# Patient Record
Sex: Male | Born: 1935 | Race: White | Hispanic: No | Marital: Married | Smoking: Former smoker
Health system: Southern US, Community
[De-identification: ages and names within clinical notes are randomized; demographics above are authoritative.]

## PROBLEM LIST (undated history)

## (undated) DIAGNOSIS — I2510 Atherosclerotic heart disease of native coronary artery without angina pectoris: Secondary | ICD-10-CM

## (undated) DIAGNOSIS — I4891 Unspecified atrial fibrillation: Secondary | ICD-10-CM

## (undated) DIAGNOSIS — K5790 Diverticulosis of intestine, part unspecified, without perforation or abscess without bleeding: Secondary | ICD-10-CM

## (undated) DIAGNOSIS — R55 Syncope and collapse: Secondary | ICD-10-CM

## (undated) DIAGNOSIS — F419 Anxiety disorder, unspecified: Secondary | ICD-10-CM

## (undated) DIAGNOSIS — Z86010 Personal history of colonic polyps: Secondary | ICD-10-CM

## (undated) DIAGNOSIS — E785 Hyperlipidemia, unspecified: Secondary | ICD-10-CM

## (undated) DIAGNOSIS — I10 Essential (primary) hypertension: Secondary | ICD-10-CM

## (undated) DIAGNOSIS — I639 Cerebral infarction, unspecified: Secondary | ICD-10-CM

## (undated) DIAGNOSIS — I2119 ST elevation (STEMI) myocardial infarction involving other coronary artery of inferior wall: Secondary | ICD-10-CM

## (undated) DIAGNOSIS — K52832 Lymphocytic colitis: Secondary | ICD-10-CM

## (undated) HISTORY — DX: ST elevation (STEMI) myocardial infarction involving other coronary artery of inferior wall: I21.19

## (undated) HISTORY — DX: Unspecified atrial fibrillation: I48.91

## (undated) HISTORY — DX: Hyperlipidemia, unspecified: E78.5

## (undated) HISTORY — DX: Lymphocytic colitis: K52.832

## (undated) HISTORY — DX: Essential (primary) hypertension: I10

## (undated) HISTORY — DX: Cerebral infarction, unspecified: I63.9

## (undated) HISTORY — DX: Syncope and collapse: R55

## (undated) HISTORY — DX: Atherosclerotic heart disease of native coronary artery without angina pectoris: I25.10

## (undated) HISTORY — DX: Anxiety disorder, unspecified: F41.9

## (undated) HISTORY — DX: Diverticulosis of intestine, part unspecified, without perforation or abscess without bleeding: K57.90

## (undated) HISTORY — PX: TOTAL KNEE ARTHROPLASTY: SHX125

## (undated) HISTORY — DX: Personal history of colonic polyps: Z86.010

---

## 1997-11-07 ENCOUNTER — Emergency Department (HOSPITAL_COMMUNITY): Admission: EM | Admit: 1997-11-07 | Discharge: 1997-11-07 | Payer: Self-pay | Admitting: Emergency Medicine

## 1997-12-29 ENCOUNTER — Ambulatory Visit (HOSPITAL_COMMUNITY): Admission: RE | Admit: 1997-12-29 | Discharge: 1997-12-29 | Payer: Self-pay | Admitting: Gastroenterology

## 1999-05-27 DIAGNOSIS — I2119 ST elevation (STEMI) myocardial infarction involving other coronary artery of inferior wall: Secondary | ICD-10-CM

## 1999-05-27 HISTORY — DX: ST elevation (STEMI) myocardial infarction involving other coronary artery of inferior wall: I21.19

## 1999-05-27 HISTORY — PX: CORONARY ANGIOPLASTY WITH STENT PLACEMENT: SHX49

## 1999-06-12 ENCOUNTER — Inpatient Hospital Stay (HOSPITAL_COMMUNITY): Admission: RE | Admit: 1999-06-12 | Discharge: 1999-06-17 | Payer: Self-pay | Admitting: Specialist

## 1999-06-15 ENCOUNTER — Encounter: Payer: Self-pay | Admitting: Specialist

## 1999-11-12 ENCOUNTER — Inpatient Hospital Stay (HOSPITAL_COMMUNITY): Admission: EM | Admit: 1999-11-12 | Discharge: 1999-11-12 | Payer: Self-pay | Admitting: Emergency Medicine

## 1999-11-12 ENCOUNTER — Encounter: Payer: Self-pay | Admitting: Emergency Medicine

## 2002-08-25 ENCOUNTER — Emergency Department (HOSPITAL_COMMUNITY): Admission: EM | Admit: 2002-08-25 | Discharge: 2002-08-25 | Payer: Self-pay | Admitting: Emergency Medicine

## 2009-03-30 ENCOUNTER — Encounter (INDEPENDENT_AMBULATORY_CARE_PROVIDER_SITE_OTHER): Payer: Self-pay | Admitting: *Deleted

## 2009-04-16 ENCOUNTER — Encounter (INDEPENDENT_AMBULATORY_CARE_PROVIDER_SITE_OTHER): Payer: Self-pay | Admitting: *Deleted

## 2009-04-17 ENCOUNTER — Ambulatory Visit: Payer: Self-pay | Admitting: Gastroenterology

## 2009-04-25 DIAGNOSIS — Z860100 Personal history of colon polyps, unspecified: Secondary | ICD-10-CM

## 2009-04-25 HISTORY — DX: Personal history of colon polyps, unspecified: Z86.0100

## 2009-04-25 HISTORY — DX: Personal history of colonic polyps: Z86.010

## 2009-05-02 ENCOUNTER — Ambulatory Visit: Payer: Self-pay | Admitting: Gastroenterology

## 2009-05-04 ENCOUNTER — Encounter: Payer: Self-pay | Admitting: Gastroenterology

## 2009-09-09 ENCOUNTER — Emergency Department (HOSPITAL_COMMUNITY): Admission: EM | Admit: 2009-09-09 | Discharge: 2009-09-09 | Payer: Self-pay | Admitting: Emergency Medicine

## 2010-08-09 ENCOUNTER — Encounter (INDEPENDENT_AMBULATORY_CARE_PROVIDER_SITE_OTHER): Payer: Self-pay | Admitting: *Deleted

## 2010-08-13 LAB — BASIC METABOLIC PANEL
BUN: 15 mg/dL (ref 6–23)
CO2: 26 mEq/L (ref 19–32)
Calcium: 8.9 mg/dL (ref 8.4–10.5)
Chloride: 98 mEq/L (ref 96–112)
Creatinine, Ser: 1.06 mg/dL (ref 0.4–1.5)
GFR calc Af Amer: >60 mL/min (ref >60)
GFR calc non Af Amer: >60 mL/min (ref >60)
Glucose, Bld: 131 mg/dL — ABNORMAL HIGH (ref 70–99)
Potassium: 4.4 mEq/L (ref 3.5–5.1)
Sodium: 132 mEq/L — ABNORMAL LOW (ref 135–145)

## 2010-08-13 LAB — CBC
HCT: 37.6 % — ABNORMAL LOW (ref 39.0–52.0)
Hemoglobin: 13.3 g/dL (ref 13.0–17.0)
MCHC: 35.4 g/dL (ref 30.0–36.0)
MCV: 99 fL (ref 78.0–100.0)
Platelets: 218 10*3/uL (ref 150–400)
RBC: 3.8 MIL/uL — ABNORMAL LOW (ref 4.22–5.81)
RDW: 12.6 % (ref 11.5–15.5)
WBC: 11.1 10*3/uL — ABNORMAL HIGH (ref 4.0–10.5)

## 2010-08-13 LAB — DIFFERENTIAL
Basophils Absolute: 0 K/uL (ref 0.0–0.1)
Basophils Relative: 0 % (ref 0–1)
Eosinophils Absolute: 0.1 K/uL (ref 0.0–0.7)
Eosinophils Relative: 1 % (ref 0–5)
Lymphocytes Relative: 6 % — ABNORMAL LOW (ref 12–46)
Lymphs Abs: 0.7 K/uL (ref 0.7–4.0)
Monocytes Absolute: 0.8 K/uL (ref 0.1–1.0)
Monocytes Relative: 7 % (ref 3–12)
Neutro Abs: 9.6 K/uL — ABNORMAL HIGH (ref 1.7–7.7)
Neutrophils Relative %: 86 % — ABNORMAL HIGH (ref 43–77)

## 2010-08-13 LAB — POCT CARDIAC MARKERS
CKMB, poc: 1.6 ng/mL (ref 1.0–8.0)
Myoglobin, poc: 65.1 ng/mL (ref 12–200)
Troponin i, poc: <0.05 ng/mL (ref 0.00–0.09)

## 2010-08-13 LAB — PROTIME-INR
INR: 1.1 (ref 0.00–1.49)
Prothrombin Time: 14.2 seconds (ref 11.6–15.2)

## 2010-08-13 LAB — APTT: aPTT: 30 s (ref 24–37)

## 2010-08-13 NOTE — Letter (Signed)
Summary: New Patient letter  [facility]  [address] [place], [place] [address]   Phone: [phone]  Fax: [phone]       08/09/2010 MRN: [id number]  Wyatt [name] [address] [place], [place]  [address]  Dear Mr. KURKA,  Welcome to the Gastroenterology Division at [facility].    You are scheduled to see Dr.  Jarold Motto on 09-12-10 at 8:30A.M.  on the 3rd floor at [facility], [address]  We ask that you try to arrive at our office 15 minutes prior to your appointment time to allow for check-in.  We would like you to complete the enclosed self-administered evaluation form prior to your visit and bring it with you on the day of your appointment.  We will review it with you.  Also, please bring a complete list of all your medications or, if you prefer, bring the medication bottles and we will list them.  Please bring your insurance card so that we may make a copy of it.  If your insurance requires a referral to see a specialist, please bring your referral form from your primary care physician.  Co-payments are due at the time of your visit and may be paid by cash, check or credit card.     Your office visit will consist of a consult with your physician (includes a physical exam), any laboratory testing he/she may order, scheduling of any necessary diagnostic testing (e.g. x-ray, ultrasound, CT-scan), and scheduling of a procedure (e.g. Endoscopy, Colonoscopy) if required.  Please allow enough time on your schedule to allow for any/all of these possibilities.    If you cannot keep your appointment, please call [phone] to cancel or reschedule prior to your appointment date.  This allows [place] the opportunity to schedule an appointment for another patient in need of care.  If you do not cancel or reschedule by 5 p.m. the business day prior to your appointment date, you will be charged a $50.00 late cancellation/no-show fee.    Thank you for  choosing [facility] for your medical needs.  We appreciate the opportunity to care for you.  Please visit [place] at our website  to learn more about our practice.                     Sincerely,                                                             The Gastroenterology Division

## 2010-08-15 ENCOUNTER — Telehealth: Payer: Self-pay | Admitting: Gastroenterology

## 2010-08-15 DIAGNOSIS — R110 Nausea: Secondary | ICD-10-CM

## 2010-08-15 DIAGNOSIS — R197 Diarrhea, unspecified: Secondary | ICD-10-CM

## 2010-08-15 DIAGNOSIS — R109 Unspecified abdominal pain: Secondary | ICD-10-CM

## 2010-08-15 MED ORDER — ONDANSETRON HCL 4 MG PO TABS: ORAL_TABLET | ORAL | Status: DC

## 2010-08-15 NOTE — Telephone Encounter (Signed)
Wife stated pt has had diarrhea x 3 days with cramping and nausea. Pt sees Dr Jarold Motto, usually no problems, just for COLONs. 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 Pt takes only Lipitor, Lisinopril and Metoprolol. Pt saw Dr Juleen China yesterday who did stool for O&P, CBC, CMP which were all normal. He was placed on Flagyl 500mg  qid, but pt can only tolerate tid.Diet doesn't seem to matter, but dairy products have been stopped. Nothing available tomorrow for Dr Jarold Motto or mid level. Amy Esterwood, PA ordered Stool for CDIFF, WBC and CULTURE and Zofran for nausea.  We will call with results and go from there. Wife stated understanding.

## 2010-08-16 ENCOUNTER — Other Ambulatory Visit: Payer: Medicare Other

## 2010-08-16 ENCOUNTER — Other Ambulatory Visit: Payer: Self-pay | Admitting: Physician Assistant

## 2010-08-16 DIAGNOSIS — R197 Diarrhea, unspecified: Secondary | ICD-10-CM

## 2010-08-16 DIAGNOSIS — R110 Nausea: Secondary | ICD-10-CM

## 2010-08-16 DIAGNOSIS — R109 Unspecified abdominal pain: Secondary | ICD-10-CM

## 2010-08-20 ENCOUNTER — Telehealth: Payer: Self-pay | Admitting: Gastroenterology

## 2010-08-20 DIAGNOSIS — R197 Diarrhea, unspecified: Secondary | ICD-10-CM

## 2010-08-21 LAB — CLOSTRIDIUM DIFFICILE BY PCR: Toxigenic C. Difficile by PCR: NOT DETECTED

## 2010-08-21 NOTE — Telephone Encounter (Signed)
Wyatt [name],try to be sure all the stool cultures have been submitted to wherever they are supposed to go... You can add him on for me for tomorrow or Friday afternoon.Aurea Graff is opening 2 appts for me   Each day  Starting at 1:30  thanks

## 2010-08-21 NOTE — Telephone Encounter (Signed)
Phoned pt's wife to ask how pt was doing. She reports pt no longer

## 2010-08-21 NOTE — Telephone Encounter (Signed)
ON 08/15/10, we ordered stool studies on pt- WBC, CDIFF by PCR and CULTURE. We never received any results, so I didn't know if the pt came or not. PCP's office called again yesterday with family requesting an appt. Called [employer] after looking in our order system and only the CDIFF was received. For some reason that order is messed up and was just sent out yesterday, 08/20/10. Worked with our lab downstairs and they only show the CDIFF even though [facility] has all 3 orders in. Per [place], it will take a few days to get the results back. I could get Aurea Graff to open up a slot for [facility] tomorrow or I can redo the stool cx which we need. Amy, please advise.

## 2010-08-21 NOTE — Telephone Encounter (Signed)
Completing last note. Per pt's wife, pt no longer has diarrhea, he's constipated now and still has abdominal cramping on and off. She reports he is feeling some better, but not back to normal. Amy Esterwood, PA suggested completing the stool studies if possible and seeing a mid level next week.  Spoke to wife and informed her I will call with an appt with a mid level for next week; she stated understanding.

## 2010-08-22 ENCOUNTER — Other Ambulatory Visit: Payer: Medicare Other

## 2010-08-22 DIAGNOSIS — R197 Diarrhea, unspecified: Secondary | ICD-10-CM

## 2010-08-22 NOTE — Telephone Encounter (Signed)
Spoke with pt's wife and then his daughter Karoline Caldwell, who wants the pt to have an appt soon. Explained the mix up in the labs and that we reordered the WBC and CULTURE for stool. Explained we were trying to get a headstart by r/o something wrong with a stool. Pt will be seen by a NP on 08/26/10 and hopefully the results will be back. Daughter wants pt to have a COLON and wants it soon, I explained Dr Jarold Motto has appt in mid April and that shouldn't be a problem. The CDIFF was negative. Family stated understanding.

## 2010-08-23 LAB — FECAL LACTOFERRIN, QUANT: Lactoferrin: POSITIVE

## 2010-08-26 ENCOUNTER — Encounter: Payer: Self-pay | Admitting: Nurse Practitioner

## 2010-08-26 ENCOUNTER — Ambulatory Visit (INDEPENDENT_AMBULATORY_CARE_PROVIDER_SITE_OTHER): Payer: Medicare Other | Admitting: Nurse Practitioner

## 2010-08-26 VITALS — BP 132/64 | HR 60 | Ht 65.0 in | Wt 173.0 lb

## 2010-08-26 DIAGNOSIS — R197 Diarrhea, unspecified: Secondary | ICD-10-CM

## 2010-08-26 NOTE — Progress Notes (Signed)
Wyatt [name] [id number] 22-Mar-1929   History of Present Illness: Wyatt [name] were is an 77 -year-old white male followed by Dr. Arlyce Dice for colorectal cancer screenings. His last screening exam was in December 2010 Patient comes in today for evaluation of recent crampy diarrhea. Several weeks ago the patient saw his primary care physician, Dr. Juleen China, for a six-day history of crampy diarrhea. He was given Lomotil and stool studies were sent. Stool for ova and parasites was negative. CBC unremarkable except for elevated MCV, comprehensive metabolic profile notable only for a sodium of 131 and chloride of 96. KUB unremarkable.  Patient returned to his PCP on March 15 at which time he complained of persistent crampy diarrhea. At that time he was started on Flagyl 500 mg 4 times a day. Patient began the medication but at a lower dose, tried to work himself up to 4 times a day. After a few days the patient complained of burning skin and hands. The patient had been referred to our office for an appointment and it was at this time the patient's daughter called to get a sooner appointment. We advised discontinuation of Flagyl. We ordered additional stool studies to include C. difficile PCR which was negative and lactoferrin which was positive. Wyatt [name] became constipated with Lomotil. He discontinued the medication, took some Alka-Seltzer, had a large bowel movement and stools have been normal for almost 4 days now. Never did have any blood in his stool. No weight loss. He had not had any antibiotics preceding the onset of diarrhea. He does complain of intermittent fatigue  but gets spurts of energy in between those times. He hit 150 golf balls yesterday.    Current Medications, Allergies, Past Medical History, Past Surgical History, Family History and Social History were reviewed in [employer] record.   Physical Exam: General: Well developed , well nourished white male no acute  distress Head: Normocephalic and atraumatic Eyes:  sclerae anicteric,conjunctive pink. Ears: Normal auditory acuity Mouth: No deformity or lesions Neck: Supple, no masses.  Lungs: Clear throughout to auscultation Heart: Regular rate and rhythm; no murmurs heard Abdomen: Soft, nontender, nondistended. No masses or hepatomegaly noted. Normal bowel sounds Rectal: Not done Musculoskeletal: Symmetrical with no gross deformities  Skin: No lesions on visible extremities Extremities: No edema or deformities noted Neurological: Alert oriented x 4, grossly nonfocal Cervical Nodes:  No significant cervical adenopathy Psychological:  Alert and cooperative. Normal mood and affect  Assessment and Plan: Diarrhea Acute diarrhea, negative C. difficile by PCR. Lactoferrin was positive . Labs 08/13/2009 were essentially normal with a white count of 8.1 hemoglobin 15.1. MCV mildly elevated at 97.8. Platelets 189. Comprehensive metabolic,profile normal except for sodium slightly low 133. TSH normal at 0.55. Physical exam unremarkable. Suspect gastroenteritis which has now resolved. Patient will call our office if he has any recurrent symptoms.

## 2010-08-26 NOTE — Assessment & Plan Note (Addendum)
Acute diarrhea, negative C. difficile by PCR. Lactoferrin was positive . Labs 08/13/2009 were essentially normal with a white count of 8.1 hemoglobin 15.1. MCV mildly elevated at 97.8. Platelets 189. Comprehensive metabolic,profile normal except for sodium slightly low 133. TSH normal at 0.55. Physical exam unremarkable. Suspect gastroenteritis which has now resolved. Patient will call our office if he has any recurrent symptoms.

## 2010-08-26 NOTE — Progress Notes (Signed)
Reviewed and agree DB 

## 2010-08-28 ENCOUNTER — Encounter: Payer: Self-pay | Admitting: Nurse Practitioner

## 2010-08-28 ENCOUNTER — Telehealth: Payer: Self-pay | Admitting: Gastroenterology

## 2010-08-28 ENCOUNTER — Ambulatory Visit (AMBULATORY_SURGERY_CENTER): Payer: Medicare Other | Admitting: *Deleted

## 2010-08-28 ENCOUNTER — Encounter: Payer: Self-pay | Admitting: Gastroenterology

## 2010-08-28 VITALS — Ht 68.5 in | Wt 175.0 lb

## 2010-08-28 DIAGNOSIS — R109 Unspecified abdominal pain: Secondary | ICD-10-CM

## 2010-08-28 DIAGNOSIS — R197 Diarrhea, unspecified: Secondary | ICD-10-CM

## 2010-08-28 MED ORDER — PEG-KCL-NACL-NASULF-NA ASC-C 100 G PO SOLR: ORAL | Status: DC

## 2010-08-28 NOTE — Telephone Encounter (Signed)
Ok..Marland KitchenMarland Kitchendrp

## 2010-08-28 NOTE — Telephone Encounter (Addendum)
OK for DIRECT COLON on 09/02/10, Pre Visit today ok'd by Quincy Carnes in [facility]. Wife and pt's daughter notified.

## 2010-08-28 NOTE — Telephone Encounter (Addendum)
Dr Jarold Motto, pt saw [place] on 08/26/10 for diarrhea and cramping for almost 4 weeks. Dr Marylen Ponto office had asked for the appt. He did O&P of stool that was negative and placed him on FLAGYL. You had no appts and neither did Amy so she ordered stools for CDIFF, WBC and CULTURE just to get started until he could be seen. The only done by Lab was the Cdiff which was - and we reordered the others. Onlyy WBC was +. Pt was ok by the time he saw [place]. Pt's daughter is an Charity fundraiser who lives out of town and stated pt is still having problems with rectal fullness and small leakage of stoll on his underwear. She is concerned pt may have another polyp or worse at his age. Last COLON 05/02/09 with adenomatous polyp. She stated pt has lost weight- we only have new pt. Pt and daughter would like a direct COLON- you have appt on 09/02/10 and 09/06/10- She asked that I reserve one on 09/06/10. OK to schedule?

## 2010-08-29 ENCOUNTER — Encounter: Payer: Self-pay | Admitting: *Deleted

## 2010-09-02 ENCOUNTER — Ambulatory Visit (AMBULATORY_SURGERY_CENTER): Payer: Medicare Other | Admitting: Gastroenterology

## 2010-09-02 ENCOUNTER — Encounter: Payer: Self-pay | Admitting: Gastroenterology

## 2010-09-02 VITALS — BP 142/77 | HR 73 | Temp 97.4°F | Resp 18 | Ht 68.0 in | Wt 175.0 lb

## 2010-09-02 DIAGNOSIS — Z860100 Personal history of colon polyps, unspecified: Secondary | ICD-10-CM

## 2010-09-02 DIAGNOSIS — K5730 Diverticulosis of large intestine without perforation or abscess without bleeding: Secondary | ICD-10-CM

## 2010-09-02 DIAGNOSIS — R197 Diarrhea, unspecified: Secondary | ICD-10-CM

## 2010-09-02 DIAGNOSIS — K5289 Other specified noninfective gastroenteritis and colitis: Secondary | ICD-10-CM

## 2010-09-02 MED ORDER — SODIUM CHLORIDE 0.9 % IV SOLN: 500.0000 mL | INTRAVENOUS | Status: DC

## 2010-09-02 NOTE — Patient Instructions (Signed)
Discharge instructions given with verbal understanding. Handout on Diverticulosis given.

## 2010-09-03 ENCOUNTER — Other Ambulatory Visit: Payer: Self-pay | Admitting: Gastroenterology

## 2010-09-03 ENCOUNTER — Telehealth: Payer: Self-pay | Admitting: *Deleted

## 2010-09-03 ENCOUNTER — Telehealth: Payer: Self-pay | Admitting: Gastroenterology

## 2010-09-03 DIAGNOSIS — R634 Abnormal weight loss: Secondary | ICD-10-CM

## 2010-09-03 DIAGNOSIS — E538 Deficiency of other specified B group vitamins: Secondary | ICD-10-CM

## 2010-09-03 DIAGNOSIS — R630 Anorexia: Secondary | ICD-10-CM

## 2010-09-03 MED ORDER — CYANOCOBALAMIN 1000 MCG/ML IJ SOLN
1000.0000 ug | INTRAMUSCULAR | Status: AC
  Administered 2010-09-04 – 2010-09-18 (×3): 1000 ug via INTRAMUSCULAR

## 2010-09-03 NOTE — Telephone Encounter (Signed)
Spoke to patients wife and patient already has an appt on 09/12/2010 which they will keep and she will bring him tomorrow for labs and his first b12 injection. We have scheduled all the injections and then they will decide about nascobal or b12 injections.

## 2010-09-03 NOTE — Telephone Encounter (Signed)

## 2010-09-03 NOTE — Telephone Encounter (Signed)
F/u needed

## 2010-09-03 NOTE — Telephone Encounter (Signed)
b12 shots,lab and ov.Marland KitchenMarland Kitchen

## 2010-09-04 ENCOUNTER — Other Ambulatory Visit (INDEPENDENT_AMBULATORY_CARE_PROVIDER_SITE_OTHER): Payer: Medicare Other

## 2010-09-04 ENCOUNTER — Ambulatory Visit (INDEPENDENT_AMBULATORY_CARE_PROVIDER_SITE_OTHER): Payer: Medicare Other | Admitting: Gastroenterology

## 2010-09-04 DIAGNOSIS — R630 Anorexia: Secondary | ICD-10-CM

## 2010-09-04 DIAGNOSIS — E538 Deficiency of other specified B group vitamins: Secondary | ICD-10-CM

## 2010-09-04 DIAGNOSIS — R634 Abnormal weight loss: Secondary | ICD-10-CM

## 2010-09-04 LAB — HEPATIC FUNCTION PANEL
Albumin: 4.1 g/dL (ref 3.5–5.2)
Alkaline Phosphatase: 56 U/L (ref 39–117)
Bilirubin, Direct: 0.1 mg/dL (ref 0.0–0.3)

## 2010-09-04 LAB — CBC WITH DIFFERENTIAL/PLATELET
Basophils Absolute: 0 10*3/uL (ref 0.0–0.1)
Basophils Relative: 0.4 % (ref 0.0–3.0)
HCT: 41.3 % (ref 39.0–52.0)
Hemoglobin: 14.4 g/dL (ref 13.0–17.0)
Lymphs Abs: 1.8 10*3/uL (ref 0.7–4.0)
MCHC: 34.9 g/dL (ref 30.0–36.0)
Monocytes Relative: 7.9 % (ref 3.0–12.0)
Neutro Abs: 6.5 10*3/uL (ref 1.4–7.7)
RDW: 12.8 % (ref 11.5–14.6)

## 2010-09-04 LAB — BASIC METABOLIC PANEL
GFR: 88.51 mL/min (ref >60.00)
Potassium: 4.5 mEq/L (ref 3.5–5.1)
Sodium: 131 mEq/L — ABNORMAL LOW (ref 135–145)

## 2010-09-04 LAB — TSH: TSH: 0.45 u[IU]/mL (ref 0.35–5.50)

## 2010-09-06 ENCOUNTER — Other Ambulatory Visit: Payer: Medicare Other | Admitting: Gastroenterology

## 2010-09-06 LAB — PROTEIN ELECTROPHORESIS, SERUM, WITH REFLEX
Beta 2: 3.5 % (ref 3.2–6.5)
Beta Globulin: 5.8 % (ref 4.7–7.2)

## 2010-09-09 ENCOUNTER — Telehealth: Payer: Self-pay | Admitting: *Deleted

## 2010-09-09 ENCOUNTER — Other Ambulatory Visit: Payer: Self-pay | Admitting: *Deleted

## 2010-09-09 MED ORDER — AMYLASE-LIPASE-PROTEASE 20-4.5-25 MU PO CPEP: 3.0000 | ORAL_CAPSULE | Freq: Three times a day (TID) | ORAL | Status: DC

## 2010-09-09 MED ORDER — PANCRELIPASE (LIP-PROT-AMYL) 6000-19000 UNITS PO CPEP: 3.0000 | ORAL_CAPSULE | Freq: Three times a day (TID) | ORAL | Status: DC

## 2010-09-09 NOTE — Telephone Encounter (Signed)
Message copied by Harlow Mares on Mon Sep 09, 2010 11:44 AM ------      Message from: Jarold Motto, DAVID      Created: Mon Sep 09, 2010  8:25 AM       Try pancrease tabs 3 po tid with meals,,,

## 2010-09-09 NOTE — Telephone Encounter (Signed)
Patients wife aware and rx sent.

## 2010-09-10 ENCOUNTER — Encounter: Payer: Self-pay | Admitting: Gastroenterology

## 2010-09-10 ENCOUNTER — Other Ambulatory Visit: Payer: Self-pay | Admitting: *Deleted

## 2010-09-10 NOTE — Telephone Encounter (Signed)
Called patients wife to advise her of pathology and she would like patient to wait until his office visit to rx entocort.

## 2010-09-10 NOTE — Telephone Encounter (Signed)
Message copied by Harlow Mares on Tue Sep 10, 2010  2:06 PM ------      Message from: Jarold Motto, DAVID      Created: Tue Sep 10, 2010  2:04 PM       His pathology is consistent with lymphocytic colitis. Please call him and his daughter and started him on Entocort 9 mg a day.

## 2010-09-11 ENCOUNTER — Encounter: Payer: Self-pay | Admitting: *Deleted

## 2010-09-12 ENCOUNTER — Ambulatory Visit (INDEPENDENT_AMBULATORY_CARE_PROVIDER_SITE_OTHER): Payer: Medicare Other | Admitting: Gastroenterology

## 2010-09-12 ENCOUNTER — Encounter: Payer: Self-pay | Admitting: Gastroenterology

## 2010-09-12 VITALS — BP 158/82 | HR 88 | Ht 68.0 in | Wt 175.0 lb

## 2010-09-12 DIAGNOSIS — K6389 Other specified diseases of intestine: Secondary | ICD-10-CM

## 2010-09-12 DIAGNOSIS — F0390 Unspecified dementia without behavioral disturbance: Secondary | ICD-10-CM

## 2010-09-12 DIAGNOSIS — R197 Diarrhea, unspecified: Secondary | ICD-10-CM

## 2010-09-12 DIAGNOSIS — E538 Deficiency of other specified B group vitamins: Secondary | ICD-10-CM | POA: Insufficient documentation

## 2010-09-12 MED ORDER — ALIGN PO CAPS: 1.0000 | ORAL_CAPSULE | Freq: Every day | ORAL | Status: AC

## 2010-09-12 NOTE — Patient Instructions (Addendum)
Today you received your B12 injection.  Stop your Pancrease. Make an appt to come back in one week for your 3rd b12 injection.  Take Align as needed.

## 2010-09-12 NOTE — Progress Notes (Signed)
History of Present Illness: This is a 77 year old Caucasian male with coronary artery disease and previous stenting. He also has well-controlled essential hypertension. Earlier this month he presented with watery diarrhea and abdominal cramping, negative stool exam for C. difficile and other pathogens. He was empirically placed on broad-spectrum antibiotics, and has had resolution of his diarrhea. He did have a low serum carotene suggesting malabsorption. Also labs showed evidence of B12 deficiency., He currently is on parenteral replacement therapy. As part of his workup he had colonoscopy performed which was unremarkable, but colon biopsies suggested lymphocytic colitis. There is no history of known pancreatitis or hepatitis. Currently he denies any general medical or GI complaints. He and his wife are moving to [place] in the next several weeks. He does not abuse alcohol, cigarette, or NSAIDs. If clinical management has been complicated by mild dementia.  Current Medications, Allergies, Past Medical History, Past Surgical History, Family History and Social History were reviewed in [employer] record.  Past Medical History  Diagnosis Date  . Hypertension   . Hyperlipemia   . Atrial fibrillation   . Personal history of colonic polyps 04/2009    SERRATED ADENOMA  . Cardiac arrhythmia   . Diverticulosis   . Anxiety   . Cataract   . Lymphocytic colitis    Past Surgical History  Procedure Date  . Coronary angioplasty with stent placement   . Total knee arthroplasty     reports that he quit smoking about 30 years ago. He has never used smokeless tobacco. He reports that he drinks about 7 ounces of alcohol per week. He reports that he does not use illicit drugs. family history includes Liver cancer in his father.  There is no history of Colon cancer. Allergies  Allergen Reactions  . Ampicillin     REACTION: unspecified  . Metronidazole     Burning skin and hands    Physical exam: Healthy appearing white male appearing younger than his stated age. I cannot appreciate stigmata of chronic liver disease. Abdominal exam shows no distention, organomegaly, masses or tenderness. Bowel sounds are normal. The patient has obvious mild dementia with very impaired short-term memory span.   Assessment and plan: Probable bacterial overgrowth syndrome treated with antibiotics several weeks ago. Not sure that the biopsy seen on colonoscopy are reactive, or whether he has resolving lymphocytic colitis. His low serum carotene and B12 level would go along with either bacterial overgrowth syndrome or chronic pancreatic insufficiency. For now, we have decided to see how he does symptomatically off any GI meds. If he has a relapse of his symptoms, he is to start probiotic therapy, and his door is to call me and we will discuss his course of therapy. His wife and daughter were present throughout the exam. Also, after he finishes his B12 shots he needs to be on weekly nasal B12 gel.  Please copy her primary care physician, referring physician, and pertinent subspecialists. Encounter Diagnoses  Name Primary?  . Vitamin B12 deficiency Yes  . Diarrhea

## 2010-09-18 ENCOUNTER — Telehealth: Payer: Self-pay | Admitting: *Deleted

## 2010-09-18 ENCOUNTER — Ambulatory Visit (INDEPENDENT_AMBULATORY_CARE_PROVIDER_SITE_OTHER): Payer: Medicare Other | Admitting: Gastroenterology

## 2010-09-18 DIAGNOSIS — E538 Deficiency of other specified B group vitamins: Secondary | ICD-10-CM

## 2010-09-18 MED ORDER — "SYRINGE/NEEDLE (DISP) 24G X 1"" 3 ML MISC": Status: DC

## 2010-09-18 MED ORDER — CYANOCOBALAMIN 500 MCG/0.1ML NA SOLN: NASAL | Status: DC

## 2010-09-18 MED ORDER — CYANOCOBALAMIN 1000 MCG/ML IJ SOLN: 1000.0000 ug | INTRAMUSCULAR | Status: DC

## 2010-09-18 NOTE — Telephone Encounter (Signed)
Advised wife that insurance will not cover nascobal and she will give injections of b12 i have sent b12 and syringes to pharmacy. She will call back if there are anymore problems.

## 2010-10-11 NOTE — Cardiovascular Report (Signed)
[place]  Patient:    DEWIGHT, CATINO                      MRN: [id number] Proc. Date: 11/12/99 Adm. Date:  [id number] Disc. Date: [id number] Attending:  Colon Branch CC:         Bernadene Person, M.D., phone [phone]             Noralyn Pick. Eden Emms, M.D. [facility]             Rollene Rotunda, M.D. [facility]                        Cardiac Catheterization  DATE OF BIRTH:  June 29, 1928  REFERRING PHYSICIAN:  Bernadene Person, M.D.  CARDIOLOGIST:  Noralyn Pick. Eden Emms, M.D.  PROCEDURES PERFORMED: 1. Left heart catheterization, selective coronary angiography. 2. Ventriculography.  DIAGNOSES: 1. Single-vessel coronary artery disease. 2. Normal left ventricular systolic function. 3. Elevated left ventricular end-diastolic pressure.  HISTORY:  Mr. Markus Daft is a 77 year old male with a history of coronary artery disease status post inferior wall myocardial infarction in 1996, treated with a stent to the mid RCA performed by Dr. Juanda Chance.  The patient presents with indigestion-like feelings over the last three weeks.  However, he has good exercise tolerance and develops no chest pain on exertion.  The patient experienced mild substernal chest pain last night and decided to come to the emergency room for further evaluation.  The patient has been pain-free in the ER and has had no ECG changes.  Initial set of troponins were within normal limits.  Of note, is that the patient has also been noncompliant with his lipid-lowering therapy.  DESCRIPTION OF PROCEDURE:  After informed consent was obtained, the patient was brought to the catheterization laboratory.  The right groin was sterilely prepped and draped.  Lidocaine 1% was used to infiltrate the right groin and a 6 French arterial sheath was placed using the modified Seldinger technique. Subsequently, a 6 [place] and JR4 catheters were used to engage the left and right coronary arteries.  Selective coronary  angiography was performed in various projections using manual injections of contrast.  After coronary angiography ventriculography was performed in a single plane RAO projection. A pigtail catheter was advanced into the left ventricle and appropriate left-sided hemodynamics were obtained.  Ventriculography was then performed using power injection of contrast.  The pigtail catheter was then removed.  At the termination of the case, the catheters and sheath were removed and manual pressure applied until adequate hemostasis was achieved.  The patient tolerated the procedure well and was transferred to the floor in stable condition.  FINDINGS:  HEMODYNAMICS:  Left ventricular pressure was 151/31 mmHg.  Aortic pressure was 152/86 mmHg.  VENTRICULOGRAPHY:  Ventriculography was performed in a single plane RAO projection.  Ejection fraction was estimated at 60%.  There was a small area of inferobasal hypokinesis.  No other wall motion abnormalities were seen.  SELECTIVE CORONARY ANGIOGRAPHY: 1. Left main coronary artery was a large medium caliber vessel with no    evidence of flow-limiting coronary artery disease. 2. Left anterior descending artery was large caliber vessel with mild    plaquing throughout with a focal eccentric stenosis of approximately    20% in the mid segment, more distal at approximately 30%. 3. Left circumflex coronary artery was free of flow-limiting coronary artery    disease. 4. Right coronary artery  was noted for a 30% stenosis in the mid segment of    the vessel situated at the prior stent site.  Further in the distal    right coronary artery is approximately 50% concentric stenosis.  The    posterolateral branch and posterior descending artery was free of    flow-limiting coronary artery disease.  CONCLUSIONS: 1. Single-vessel coronary artery disease of the right coronary artery with a    30% in-stent re-stenosis of the mid right coronary artery and a 50%  distal    stenosis. 2. Normal left ventricular systolic function. 3. Elevated left ventricular end-diastolic pressure consistent with diastolic    dysfunction.  RECOMMENDATIONS:  Angiographic images were reviewed with Dr. Antoine Poche.  In discussing the patients symptoms, it was felt that his symptoms are very atypical for an acute coronary syndrome.  There is no evidence of flow-limiting coronary artery disease by angiography.  It is felt that the patient needs to adhere to his medical therapy with aggressive risk factor modification.  Apparently the patient has not been compliant with his regimen in the past.  It is felt that the patient can be discharged later today with further work-up on an outpatient basis for possible GI-related symptoms. DD:  11/12/99 TD:  11/14/99 Job: [id number]

## 2010-10-11 NOTE — Consult Note (Signed)
[place]. [facility]  Patient:    Wyatt [name]                       MRN: [id number] Proc. Date: 06/15/99 Adm. Date:  [id number] Attending:  Erasmo Leventhal CC:         Valma Cava, M.D., Orthopaedics                          Consultation Report  REASON FOR CONSULTATION:  Postoperative fever and some tremor.  HISTORY OF PRESENT ILLNESS:  This 77 year old white male is a regular medical patient of Dr. Michiel Sites who is unavailable at this time.  Patient underwent a total knee arthroplasty due to degenerative arthritis on 06/02/99 by Dr. Valma Cava.  In the postoperative state, he was doing quite well, but did become febrile.  His temperature was recorded as high as 102 degrees.  He did receive the usual preoperative antibiotics and postoperatively was given vancomycin, one dose.  Initially, he was quite alert, cooperative, and progressing very rapidly; however, after about the second day, he became a little more agitated, had some tremor, and it was felt that part of this was due to alcohol  withdrawal.  The patient is known to consume four or five alcoholic beverages daily and it has been about four days since he had any alcohol.  There was no evidence of any cough, shortness of breath, any wound infection that was evident to the orthopaedic staff.  He denied any urinary symptoms and has had no problems with  prostate infections.  By the morning of my consultation, 06/15/99, his temperature was 98.4 degrees.  PAST MEDICAL HISTORY:  ALLERGIES:  AMOXICILLIN and PENICILLIN.  DOXYCYCLINE caused severe GI upset and PRAVACHOL did not seem to agree with him, but there were some abnormal liver function studies thought to be due to the drug or his alcohol intake.  He was hospitalized in 8/96 for an acute chest pain syndrome, which was accompanied by EKG changes of ST depression in the lateral and high lateral leads. He underwent  emergent cardiac catheterization by Dr. Charlton Haws and the putative lesion was found to be a mid-right coronary artery occlusion that required angioplasty and stenting by Dr. Charlies Constable.  MEDICATIONS:   He is very noncompliant with medications, which sometimes consists of Zocor and sometimes consists of niacin and usually consists of a dietary product called Benechol.  REVIEW OF SYSTEMS:   HEENT:  He denies any problems with headaches, sore throat, but does have a little bit of dry mouth due to the environment in the hospital.  Denies any earaches.   Respiratory:  He denies any cough or chest congestion. e is using his incentive spirometer routinely.   Cardiovascular:  Denies any chest pain or palpitations.  GI:  Denies any nausea, vomiting, diarrhea, constipation at this time.  He did undergo a colonoscopy with biopsies back in 8/99 by Dr. Anselmo Rod due to some lower GI bleeding; however, no abnormal lesions were found. GU:  Denies any kidney stones, dysuria, hematuria.  Musculoskeletal:  He does have degenerative arthritis of the left knee.  He says his right leg is as good as a  newborn baby and he is really unclear at all what was the cause of his left knee disease.   Hematologic:  No history of bleeding or bruising tendency. Neuropsychiatric:  Family denies that he has  ever been in DTs before.  He does ave a lot of anxiety and stress from review of past records.  FAMILY HISTORY:  Really not pertinent to the present illness and acute process.   LABORATORY DATA:  Laboratory studies are reviewed.  His urinalysis on 06/10/99 showed yellow, clear urine; specific gravity 1.015, pH 8, 1 unit of urobilinogen, 0-5 WBCs, 0-5 RBCs, but otherwise unremarkable.  His urine specimen from last evening is not available at the time of this dictation.   He also had blood cultures last night and there is no report of any growth at this early date. His CBC reportedly on  admission showed a WBC 8,100, but that has not been repeated.  His hemoglobin has dropped from 14.9 down to 9.1, hematocrit dropped from 41.5 own to 25.6%.  His protime is already becoming therapeutic with an INR of 1.8.  His  liver functions show slight elevation of AST up to 59 and ALT up to 56 with upper normals of 37 and 40, respectively.  His blood type is A+ with antibody screen negative.  A chest x-ray report is not on the chart and the last study was performed 05/16/99, which showed no evidence of active disease in the chest.  On review of the course, it is noted that the patient initially was doing quite  well, then developed some agitation, for which Librium was given and additional  doses of Mepergan Fortis consisting of Demerol and Phenergan last evening. This morning, he is somewhat more sedated and the daughter who is a nurse notes that  this appears to be far in excess of normal relaxation for him.  IMPRESSION: 1. Status post total left knee replacement with usual tissue disruption and    potential for fever. 2. Low grade fever, probably combination of the surgery, early DTs, with the    biochemical alterations there from. 3. Possibly some early atelectasis due to his bedfast state. 4. Coronary artery disease, status post coronary angioplasty and stenting in 8/96    and doing well. 5. Alcohol abuse with possible early DTs, but currently not evident.  PLAN/RECOMMENDATIONS:   I do think that patient is at this time doing perfectly  well.  His temperature is down to 98.4 degrees, he is reasonably alert given his sedating drugs last night.  The postoperative course appears to be relatively uncomplicated at this point.  I do recommend following up on the urinalysis, which was done last night and the blood cultures, which are pending.  If he has further fever, i.e. anything over 100.5 degrees, I would recommend a chest x-ray and reassessment of his wound area  for infection.  If you have further questions, please contact Dr. Juleen China, his regular primary physician on his return Monday, 06/17/99.  DD:  06/15/99 TD:  06/15/99 Job: [id number]

## 2010-10-11 NOTE — H&P (Signed)
[place]. [facility]  Patient:    Wyatt [name], Wyatt [name]                        MRN: [id number] Adm. Date:  11/12/99 Dictator:   Abelino Derrick, P.A.C. [facility]                         History and Physical  CHIEF COMPLAINT:  Chest pain.  HISTORY OF PRESENT ILLNESS:  The patient is a male with a history of coronary disease.  He had a DMI in 1996 treated with an RCA stent. He is admitted now with recurrent chest pain described as indigestion which has been off and on for the last three weeks. Denies any arm pain or diaphoresis. He has had pain at about 3 a.m. the last few nights that radiates to his back.  He had slight pain in the emergency room relieved with nitroglycerin.  PAST MEDICAL HISTORY:  Remarkable for hypertension and hyperlipidemia.  He had a left total knee replacement in July 2001.  MEDICATIONS:  Zocor 20 mg every other day.  Norvasc as needed and no aspirin.  ALLERGIES:  Penicillin.  SOCIAL HISTORY:  He is a nonsmoker.  He is married.  He is very active.  FAMILY HISTORY: Remarkable for coronary disease, he has two sisters who have coronary disease.  REVIEW OF SYSTEMS:  Essentially unremarkable except for as noted above.  He has no history of GI bleeding or peptic ulcer disease or kidney disease. He has reflux symptoms and takes Pepcid AC p.r.n.  PHYSICAL EXAMINATION:  VITAL SIGNS: Blood pressure 138/79, pulse 100, respirations 18.  GENERAL:  He is a well-developed, well-nourished male in no acute distress.  HEENT: Normocephalic. Extraocular movements are intact.  Sclerae is nonicteric.  Lids and conjunctivae within normal limits.  NECK: Without JVD and without bruit.  CHEST: Clear to auscultation and percussion.  CARDIAC:  Reveals regular rate and rhythm without murmur, rub or gallop. Normal S1 and S2.  ABDOMEN:  Nontender.  No hepatosplenomegaly.  EXTREMITIES: Without edema.  Distal pulses are intact. There are no bruits noted. He has  left knee surgical scar.  NEUROLOGIC:  Grossly intact. She is awake, alert and oriented and cooperative. There is no obvious stress deficit.  EKG shows normal sinus rhythm with inferior T waves.  Chest x-ray shows mild cardiac enlargement with no active disease. Labs are pending.  IMPRESSION: 1. Unstable angina 2. Known coronary disease with diaphragmatic myocardial infarction and    intervention in 1996. 3. Hyperlipidemia. 4. Hypertension. 5. Noncompliance.  PLAN:  Will add beta blocker and Altace and heparin. We will set him up for an elective catheterization today. DD:  11/12/99 TD:  11/12/99 Job: [id number]

## 2010-10-11 NOTE — Op Note (Signed)
[place]. [facility]  Patient:    Wyatt [name]                       MRN: [id number] Proc. Date: 06/12/99 Adm. Date:  [id number] Attending:  Erasmo Leventhal Dictator:   Kaylyn Layer. Michelle Piper, M.D. CC:         Anesthesia Dept.                           Operative Report  HISTORY OF PRESENT ILLNESS:  I was consulted by Dr. Hayden Rasmussen to place a subdural catheter into Mr. Schamp for postoperative pain relief.  I had the opportunity to discuss the risks and benefits with the patient who agreed to the catheter placement.  DESCRIPTION OF PROCEDURE:  At the end of the procedure, prior to anesthesia, the patient was placed turned in the right lateral decubitus position.  Back was prepped with Betadine using a #17 Tuohy needle, the epidural space was easily cannulated in the midline at the L2-3 space using the loss of resistance technique. A catheter was inserted 5 cm into the epidural space, then catheter removed, then needle removed.  After negative aspirations, 15 mcg of fentanyl and 10 cc of 1/2% Xylocaine was injected.  The patient was then turned supine, extubated, and taken to the recovery room in stable condition.  In the PACU a fentanyl/Marcaine infusion will be begun.  He will be followed on the floor by anesthesiology service. DD:  06/12/99 TD:  06/13/99 Job: [id number]

## 2010-10-11 NOTE — Discharge Summary (Signed)
[place]. [facility]  Patient:    Wyatt [name], Wyatt [name]                        MRN: [id number] Adm. Date:  11/12/99 Disc. Date: 11/12/99 Attending:  Noralyn Pick. Eden Emms, M.D. [facility] Dictator:   Abelino Derrick, P.A.C. [facility] CC:         Adela Lank, M.D., Endocrinology             Charlton Haws, M.D., Cardiologist                           Discharge Summary  HISTORY OF PRESENT ILLNESS:  Wyatt [name] is a 77 year old male with coronary disease who had an DMI in 1996, treated with an RCA stent.  He was admitted to he emergency room with recurrent epigastric pain.  Symptoms were suspicious for angina.  He was admitted to the telemetry.  CK/MBs and troponins were obtained nd these were negative.  He was cathed that day on an elective basis, which showed 50% RCA.  He has normal left ventricular function.  The plan is for continued medical therapy.  He was discharged late on the 19th.  DISCHARGE MEDICATIONS: 1. Altace 2.5 mg a day. 2. Coated aspirin q. day. 3. Zocor 20 mg every other day. 4. Pepcid AC or Prevacid p.r.n.  LABORATORY DATA:  EKG shows normal sinus rhythm with inferior Q waves.  BUN 15,  creatinine 0.9, sodium 4.2.  DISPOSITION:  Patient is discharged in stable condition and will follow up with the P.A. in two weeks and then Dr. Eden Emms.  He does have some problems with medical  noncompliance and does take medicines as he thinks he needs them.  I discussed his at length with Wyatt [name] before he went home.  We did change his Norvasc, which he was taking one a twice a week to Altace 2.5 mg a day and encouraged him to take an aspirin q. day.  He takes Zocor 20 mg b.i.d. because of problems with erectile dysfunction.  Beta blocker was considered, but because of this, not added.  DISCHARGE DIAGNOSES: 1. Chest pain, probably noncardiac in origin with no obstructive coronary disease on catheterization this admission.  2. Known coronary disease status post  PCI in 1996 to the RCA.  3. Hyperlipidemia.  4. History of hypertension.  Patient discharged in stable condition. DD:  11/12/99 TD:  11/12/99 Job: [id number]

## 2010-10-11 NOTE — H&P (Signed)
[place]. [facility]  Patient:    Wyatt [name], Wyatt [name]                        MRN: Adm. Date:  06/12/99 Attending:  R. Valma Cava, M.D. Dictator:   Druscilla Brownie. Shela Nevin, P.A.-C. CC:         Bernadene Person, M.D.                         History and Physical  DATE OF BIRTH:  1929-04-12  CHIEF COMPLAINT:  "Pain in my left knee."  HISTORY OF PRESENT ILLNESS:  This 77 year old white male has been seen by [place] for continuing problems concerning his left knee.  He is a very stoic individual who leads a very active life style.  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 He has received a knee arthroscopy back in February 1996, showing that he had a grade 4 defect, medial compartment arthrosis of the left knee.  He has been stoic over the years, but now is to the point where he has varus deformity to the knee, end-stage arthritis, and extreme difficulty getting about.  He is afraid he might have to go to a cane, as his next support for ambulation.  The examination has shown the range of motion limited somewhat in extension and is about 120 degrees flexion.  He has palpable medial osteophytes and crepitation ith range of motion.  He has diffuse tenderness along the joint line.  X-rays have shown significant osteophyte formation in the patellofemoral joint, in the posterior condyle and posterior tibial area.  Varus malalignment is noted with one on bone deformity.  Due to the progression of osteoarthritis, it was felt that he patient would benefit from a total knee replacement arthroplasties, so that he an maintain and even enhance his life style, so a total knee replacement arthroplasty is planned to the left knee.  PAST MEDICAL HISTORY:  The patients physician is Dr. Lacretia Nicks. Adela Lank. 1. He treats him for hypertension. 2. Hyperlipidemia. 3. Myocardial infarction in the past. 4. He has a history of distant pneumonia in 1975.  He has  recently seen Dr. Juleen China who has cleared him for this surgical procedure.  PAST SURGICAL HISTORY:  A knee arthroscopy to the left about three years ago.  CURRENT MEDICATIONS: 1. Zocor occasionally. 2. Vasotec occasionally.  ALLERGIES:  AMPICILLIN with a reaction in 1975.  SOCIAL HISTORY:  The patient is married.  He drinks approximately anywhere from  four to six alcoholic beverages per day.  FAMILY HISTORY:  Noncontributory.  REVIEW OF SYSTEMS:  CNS:  No seizure disorder, paralysis, numbness, or double vision.  Respiratory:  No productive cough or hemoptysis.  No shortness of breath. Cardiovascular:  No chest pain, no angina, no orthopnea.  Gastrointestinal:  No  nausea, vomiting, melena, or bloody stools.  Genitourinary:  No discharge, dysuria, or hematuria.  Musculoskeletal:  Primarily in the present illness with his left  knee.  PHYSICAL EXAMINATION:  GENERAL:  Alert and cooperative, friendly, talkative 77 year old white male.  VITAL SIGNS:  Blood pressure 160/82, pulse 80, respirations 12.  HEENT:  Normocephalic.  PERRLA.  EOMs intact.  Oropharynx clear.  CHEST:  Clear to auscultation.  No rales or rhonchi.  HEART:  A regular rate and rhythm.  No murmurs are heard.  ABDOMEN:  Soft, nontender.  Liver and spleen not felt.  GENITOURINARY/RECTAL/PELVIC/BREASTS:  Not done, not pertinent to the present illness.  EXTREMITIES:  Left knee as in the present illness above.  ADMISSION DIAGNOSES: 1. End-stage osteoarthritis, left knee. 2. Hypertension. 3. Hyperlipidemia.  PLAN:  The patient will be admitted for a total knee replacement arthroplasty at [facility] on June 12, 1999.  He has donated 2 units of autologous blood for this procedure.  We will certainly ask Dr. Juleen China to follow  along with [place] should this patient have any medical problems. DD:  06/05/99 TD:  06/05/99 Job: [id number]

## 2011-06-03 ENCOUNTER — Other Ambulatory Visit: Payer: Self-pay | Admitting: Endocrinology

## 2011-06-03 DIAGNOSIS — G453 Amaurosis fugax: Secondary | ICD-10-CM

## 2011-06-05 ENCOUNTER — Ambulatory Visit
Admission: RE | Admit: 2011-06-05 | Discharge: 2011-06-05 | Disposition: A | Discharge status: Home / Self Care | Payer: Medicare Other | Source: Ambulatory Visit | Attending: Endocrinology | Admitting: Endocrinology

## 2011-06-05 DIAGNOSIS — G453 Amaurosis fugax: Secondary | ICD-10-CM

## 2012-05-26 DIAGNOSIS — R55 Syncope and collapse: Secondary | ICD-10-CM

## 2012-05-26 HISTORY — DX: Syncope and collapse: R55

## 2012-07-12 ENCOUNTER — Encounter (HOSPITAL_BASED_OUTPATIENT_CLINIC_OR_DEPARTMENT_OTHER): Payer: Self-pay | Admitting: *Deleted

## 2012-07-12 ENCOUNTER — Emergency Department (HOSPITAL_BASED_OUTPATIENT_CLINIC_OR_DEPARTMENT_OTHER): Payer: Medicare Other

## 2012-07-12 ENCOUNTER — Emergency Department (HOSPITAL_BASED_OUTPATIENT_CLINIC_OR_DEPARTMENT_OTHER)
Admission: EM | Admit: 2012-07-12 | Discharge: 2012-07-12 | Disposition: A | Discharge status: Home / Self Care | Payer: Medicare Other | Attending: Emergency Medicine | Admitting: Emergency Medicine

## 2012-07-12 DIAGNOSIS — Z860100 Personal history of colon polyps, unspecified: Secondary | ICD-10-CM | POA: Insufficient documentation

## 2012-07-12 DIAGNOSIS — Y9289 Other specified places as the place of occurrence of the external cause: Secondary | ICD-10-CM | POA: Insufficient documentation

## 2012-07-12 DIAGNOSIS — F411 Generalized anxiety disorder: Secondary | ICD-10-CM | POA: Insufficient documentation

## 2012-07-12 DIAGNOSIS — S0100XA Unspecified open wound of scalp, initial encounter: Secondary | ICD-10-CM | POA: Insufficient documentation

## 2012-07-12 DIAGNOSIS — S0990XA Unspecified injury of head, initial encounter: Secondary | ICD-10-CM | POA: Insufficient documentation

## 2012-07-12 DIAGNOSIS — I498 Other specified cardiac arrhythmias: Secondary | ICD-10-CM | POA: Insufficient documentation

## 2012-07-12 DIAGNOSIS — W010XXA Fall on same level from slipping, tripping and stumbling without subsequent striking against object, initial encounter: Secondary | ICD-10-CM | POA: Insufficient documentation

## 2012-07-12 DIAGNOSIS — Z23 Encounter for immunization: Secondary | ICD-10-CM | POA: Insufficient documentation

## 2012-07-12 DIAGNOSIS — E785 Hyperlipidemia, unspecified: Secondary | ICD-10-CM | POA: Insufficient documentation

## 2012-07-12 DIAGNOSIS — W1809XA Striking against other object with subsequent fall, initial encounter: Secondary | ICD-10-CM | POA: Insufficient documentation

## 2012-07-12 DIAGNOSIS — Z8679 Personal history of other diseases of the circulatory system: Secondary | ICD-10-CM | POA: Insufficient documentation

## 2012-07-12 DIAGNOSIS — Z8669 Personal history of other diseases of the nervous system and sense organs: Secondary | ICD-10-CM | POA: Insufficient documentation

## 2012-07-12 DIAGNOSIS — Z87891 Personal history of nicotine dependence: Secondary | ICD-10-CM | POA: Insufficient documentation

## 2012-07-12 DIAGNOSIS — W19XXXA Unspecified fall, initial encounter: Secondary | ICD-10-CM

## 2012-07-12 DIAGNOSIS — I10 Essential (primary) hypertension: Secondary | ICD-10-CM | POA: Insufficient documentation

## 2012-07-12 DIAGNOSIS — Y9301 Activity, walking, marching and hiking: Secondary | ICD-10-CM | POA: Insufficient documentation

## 2012-07-12 DIAGNOSIS — Z79899 Other long term (current) drug therapy: Secondary | ICD-10-CM | POA: Insufficient documentation

## 2012-07-12 DIAGNOSIS — Z9889 Other specified postprocedural states: Secondary | ICD-10-CM | POA: Insufficient documentation

## 2012-07-12 DIAGNOSIS — M79609 Pain in unspecified limb: Secondary | ICD-10-CM | POA: Insufficient documentation

## 2012-07-12 DIAGNOSIS — Z8719 Personal history of other diseases of the digestive system: Secondary | ICD-10-CM | POA: Insufficient documentation

## 2012-07-12 DIAGNOSIS — R001 Bradycardia, unspecified: Secondary | ICD-10-CM

## 2012-07-12 LAB — BASIC METABOLIC PANEL
BUN: 22 mg/dL (ref 6–23)
Calcium: 9.5 mg/dL (ref 8.4–10.5)
Creatinine, Ser: 1 mg/dL (ref 0.50–1.35)
GFR calc Af Amer: 79 mL/min — ABNORMAL LOW (ref >90)
GFR calc non Af Amer: 68 mL/min — ABNORMAL LOW (ref >90)

## 2012-07-12 MED ORDER — ACETAMINOPHEN 325 MG PO TABS: 325.0000 mg | ORAL_TABLET | Freq: Once | ORAL | Status: DC

## 2012-07-12 MED ORDER — TETANUS-DIPHTH-ACELL PERTUSSIS 5-2.5-18.5 LF-MCG/0.5 IM SUSP
0.5000 mL | Freq: Once | INTRAMUSCULAR | Status: AC
  Administered 2012-07-12: 0.5 mL via INTRAMUSCULAR
  Filled 2012-07-12: qty 0.5

## 2012-07-12 NOTE — Discharge Instructions (Signed)
Follow up with PCP Dr. Juleen China in 1-2 days. He may need to decrease your Metoprolol dose due to bradycardia 53-55 beats per minute   Bradycardia You have a slow heart rate. This is called bradycardia. At rest, the normal heart rate is between 60-100 beats per minute. A slow heart may cause weakness, dizziness, loss of consciousness, and shortness of breath. This gets worse when you are active.  The medical causes of bradycardia can include:  Heart and thyroid problems.  High potassium.  Side effects of some medicines. Well-trained athletes may have heart rates as slow as 42 beats per minute. Evaluation of bradycardia may require an electrocardiogram (ECG), blood tests, and possibly other heart studies.  Bradycardia due to heart disease can be a serious problem. Damage to the heart's electrical system may require a temporary or permanent pacemaker. Beta-blocker drugs, digoxin, and other medicines used to control blood pressure and heart rhythms will also slow the heart. These medicines may need to be used in lower doses, or be stopped, if you have problems from the bradycardia.  SEEK IMMEDIATE MEDICAL CARE IF:   You develop fainting, extreme weakness, shortness of breath, or fever.  You develop severe chest or abdominal pain, repeated vomiting, or dehydration.  You become sweaty and weak. MAKE SURE YOU:   Understand these instructions.  Will watch your condition.  Will get help right away if you are not doing well or get worse. Document Released: 05/12/2005 Document Revised: 08/04/2011 Document Reviewed: 08/09/2008 [facility] Patient Information 2013 [place], [place].  Fall Prevention and Home Safety Falls cause injuries and can affect all age groups. It is possible to use preventive measures to significantly decrease the likelihood of falls. There are many simple measures which can make your home safer and prevent falls. OUTDOORS  Repair cracks and edges of walkways and  driveways.  Remove high doorway thresholds.  Trim shrubbery on the main path into your home.  Have good outside lighting.  Clear walkways of tools, rocks, debris, and clutter.  Check that handrails are not broken and are securely fastened. Both sides of steps should have handrails.  Have leaves, snow, and ice cleared regularly.  Use sand or salt on walkways during winter months.  In the garage, clean up grease or oil spills. BATHROOM  Install night lights.  Install grab bars by the toilet and in the tub and shower.  Use non-skid mats or decals in the tub or shower.  Place a plastic non-slip stool in the shower to sit on, if needed.  Keep floors dry and clean up all water on the floor immediately.  Remove soap buildup in the tub or shower on a regular basis.  Secure bath mats with non-slip, double-sided rug tape.  Remove throw rugs and tripping hazards from the floors. BEDROOMS  Install night lights.  Make sure a bedside light is easy to reach.  Do not use oversized bedding.  Keep a telephone by your bedside.  Have a firm chair with side arms to use for getting dressed.  Remove throw rugs and tripping hazards from the floor. KITCHEN  Keep handles on pots and pans turned toward the center of the stove. Use back burners when possible.  Clean up spills quickly and allow time for drying.  Avoid walking on wet floors.  Avoid hot utensils and knives.  Position shelves so they are not too high or low.  Place commonly used objects within easy reach.  If necessary, use a sturdy step stool with  a grab bar when reaching.  Keep electrical cables out of the way.  Do not use floor polish or wax that makes floors slippery. If you must use wax, use non-skid floor wax.  Remove throw rugs and tripping hazards from the floor. STAIRWAYS  Never leave objects on stairs.  Place handrails on both sides of stairways and use them. Fix any loose handrails. Make sure  handrails on both sides of the stairways are as long as the stairs.  Check carpeting to make sure it is firmly attached along stairs. Make repairs to worn or loose carpet promptly.  Avoid placing throw rugs at the top or bottom of stairways, or properly secure the rug with carpet tape to prevent slippage. Get rid of throw rugs, if possible.  Have an electrician put in a light switch at the top and bottom of the stairs. OTHER FALL PREVENTION TIPS  Wear low-heel or rubber-soled shoes that are supportive and fit well. Wear closed toe shoes.  When using a stepladder, make sure it is fully opened and both spreaders are firmly locked. Do not climb a closed stepladder.  Add color or contrast paint or tape to grab bars and handrails in your home. Place contrasting color strips on first and last steps.  Learn and use mobility aids as needed. Install an electrical emergency response system.  Turn on lights to avoid dark areas. Replace light bulbs that burn out immediately. Get light switches that glow.  Arrange furniture to create clear pathways. Keep furniture in the same place.  Firmly attach carpet with non-skid or double-sided tape.  Eliminate uneven floor surfaces.  Select a carpet pattern that does not visually hide the edge of steps.  Be aware of all pets. OTHER HOME SAFETY TIPS  Set the water temperature for 120 F (48.8 C).  Keep emergency numbers on or near the telephone.  Keep smoke detectors on every level of the home and near sleeping areas. Document Released: 05/02/2002 Document Revised: 11/11/2011 Document Reviewed: 08/01/2011 [facility] Patient Information 2013 [place], [place].

## 2012-07-12 NOTE — ED Notes (Signed)
Pt was walking outside down his drive way this morning was looking at a truck across the street and wasn't looking where he was going and slipped on a patch of ice has small abrasion/laceration on top of head. Pt denies loss of consciousness pt denies any visual disturbances pt also denies pain

## 2012-07-12 NOTE — ED Provider Notes (Signed)
I have personally seen and examined the patient.  I have discussed the plan of care with the resident.  I have reviewed the documentation on PMH/FH/Soc. History.  I have reviewed the documentation of the resident and agree.  I have reviewed and agree with the ECG interpretation(s) documented by the resident.  Advised to withhold lisinopril and close checkup with PCP for BP and BMET re-eval  Joya Gaskins, MD 07/12/12 [phone]

## 2012-07-12 NOTE — ED Provider Notes (Signed)
History     CSN: [id number]  Arrival date & time 07/12/12  4098   First MD Initiated Contact with Patient 07/12/12 [phone]      Chief Complaint  Patient presents with  . Fall    slipped on black ice fell hit head    (Consider location/radiation/quality/duration/timing/severity/associated sxs/prior treatment) HPI Comments: 79 y.o PMH CAD with stent, HTN, b12 def, HLD, h/o Afib 2-3 years ago, serrated adenoma, diverticulosis, anxiety, b/l cataract surgery, lymphocytic colitis, former smoker.   He presents after mechanical fall on black ice this am.  Falling backwards and hitting his head on the concrete.  Denies lightheadedness/dizziness.  He had a minor h/a which resolved after Tylenol.  He also took Metoprolol 25 mg this am.  He states he fell 2 weeks ago and landed on his left shoulder and went for follow up and imaging.  Other complaints today left arm pain/ache shooting to his left chest newly noted since fall 2 weeks ago.    Meds: Lisinopril 5 or 10 mg, ASA 81 mg (intemittently), Metoprolol 25 mg  PsuH: total knee left, cardiac stent SH: likes golf, from [place] lived in [place] for 40 years   PCP: Dr. Adela Lank  Patient is a 79 y.o. male presenting with fall. The history is provided by the patient. No language interpreter was used.  Fall The accident occurred less than 1 hour ago. The fall occurred while walking. He fell from an unknown height. He landed on concrete. The point of impact was the head. Pain location: resolved h/a. Pertinent negatives include no visual change, no nausea, no vomiting, no headaches and no loss of consciousness. Exacerbated by: no worsening factors  He has tried acetaminophen (h/a improved) for the symptoms.    Past Medical History  Diagnosis Date  . Hypertension   . Hyperlipemia   . Atrial fibrillation   . Personal history of colonic polyps 04/2009    SERRATED ADENOMA  . Cardiac arrhythmia   . Diverticulosis   . Anxiety   . Cataract   . Lymphocytic  colitis     Past Surgical History  Procedure Laterality Date  . Coronary angioplasty with stent placement    . Total knee arthroplasty      Family History  Problem Relation Age of Onset  . Colon cancer Neg Hx   . Liver cancer Father     History  Substance Use Topics  . Smoking status: Former Smoker    Quit date: 09/01/1980  . Smokeless tobacco: Never Used  . Alcohol Use: 0.0 oz/week     Comment: 2 daily       Review of Systems  Cardiovascular: Negative for chest pain.  Gastrointestinal: Negative for nausea and vomiting.  Neurological: Negative for dizziness, loss of consciousness, light-headedness and headaches.  All other systems reviewed and are negative.    Allergies  Ampicillin and Metronidazole  Home Medications   Current Outpatient Rx  Name  Route  Sig  Dispense  Refill  . atorvastatin (LIPITOR) 10 MG tablet   Oral   Take 10 mg by mouth daily. 1/2 tablet daily          . cyanocobalamin (,VITAMIN B-12,) 1000 MCG/ML injection   Intramuscular   Inject 1 mL (1,000 mcg total) into the muscle every 30 (thirty) days.   10 mL   3   . lisinopril (PRINIVIL,ZESTRIL) 5 MG tablet   Oral   Take 5 mg by mouth daily. Whole tablet in the morning and 1/2  tablet at night         . LORazepam (ATIVAN) 0.5 MG tablet   Oral   Take 0.5 mg by mouth as needed. Sleep/anxiety          . metoprolol tartrate (LOPRESSOR) 25 MG tablet   Oral   Take 25 mg by mouth 2 (two) times daily.           . Multiple Vitamin (MULTIVITAMIN) tablet   Oral   Take 1 tablet by mouth daily.           . SYRINGE-NEEDLE, DISP, 3 ML 24G X 1" 3 ML MISC      Use with b12 injections   12 each   0     BP 193/91  Pulse 55  Temp(Src) 98.7 F (37.1 C) (Oral)  Resp 18  Ht 5' 8.5" (1.74 m)  Wt 175 lb (79.379 kg)  BMI 26.22 kg/m2  SpO2 100%  Physical Exam  Nursing note and vitals reviewed. Constitutional: He is oriented to person, place, and time. He appears well-developed and  well-nourished. He is cooperative. No distress.  HENT:  Head: Normocephalic. Head is with abrasion.    Mouth/Throat: Oropharynx is clear and moist. He has dentures. No oropharyngeal exudate.  Eyes: Conjunctivae are normal. Pupils are equal, round, and reactive to light. Right eye exhibits no discharge. Left eye exhibits no discharge. No scleral icterus.  Cardiovascular: Regular rhythm and intact distal pulses.  Bradycardia present.   No murmur heard. HR 55   Pulmonary/Chest: Effort normal and breath sounds normal. No respiratory distress. He has no wheezes.  Abdominal: Soft. Bowel sounds are normal. He exhibits no distension. There is no tenderness.  Neurological: He is alert and oriented to person, place, and time. He has normal strength. Gait normal.  5/5 hand grip b/l    Skin: Skin is warm and dry. No rash noted. He is not diaphoretic.  Psychiatric: He has a normal mood and affect. His speech is normal and behavior is normal. Judgment and thought content normal. Cognition and memory are normal.  Pleasant good sense of humor     ED Course  Procedures (including critical care time)  Labs Reviewed  BASIC METABOLIC PANEL - Abnormal; Notable for the following:    Sodium 129 (*)    Chloride 93 (*)    Glucose, Bld 131 (*)    GFR calc non Af Amer 68 (*)    GFR calc Af Amer 79 (*)    All other components within normal limits   Ct Head Wo Contrast  07/12/2012  *RADIOLOGY REPORT*  Clinical Data: History of fall with trauma to the head.  CT HEAD WITHOUT CONTRAST  Technique:  Contiguous axial images were obtained from the base of the skull through the vertex without contrast.  Comparison: No priors.  Findings: Mild cerebral and cerebellar atrophy.  Patchy and confluent areas of decreased attenuation throughout the deep and periventricular white matter of the cerebral hemispheres bilaterally, most compatible with chronic microvascular ischemic disease. No acute displaced skull fractures are  identified.  No acute intracranial abnormality.  Specifically, no evidence of acute post-traumatic intracranial hemorrhage, no definite regions of acute/subacute cerebral ischemia, no focal mass, mass effect, hydrocephalus or abnormal intra or extra-axial fluid collections. The visualized paranasal sinuses and mastoids are well pneumatized.  IMPRESSION: 1.  No acute displaced skull fracture or acute intracranial findings. 2.  Mild cerebral and cerebellar atrophy with chronic microvascular ischemic changes in the deep and periventricular white matter  of the cerebral hemispheres bilaterally.   Original Report Authenticated By: Trudie Reed, M.D.      Date: 07/12/2012  Rate: 53  Rhythm: sinus bradycardia  QRS Axis: normal  Intervals: normal  ST/T Wave abnormalities: peaked T waves V2, V3, V4, V5  Conduction Disutrbances:none  Narrative Interpretation:   Old EKG Reviewed: changes noted   1. Fall   2. Sinus bradycardia       MDM  Mechanical Fall Cleaned laceration scalp with NS and hydrogen peroxide, rec Bacitracin at home Tdap CT head w/o contrast BMET for peaked T waves on EKG  Sinus Bradycardia  On Metoprolol 25 mg qd PCP may need to adjust   Shirlee Latch MD [phone]         Annett Gula, MD 07/12/12 1014  Annett Gula, MD 07/12/12 1109

## 2012-07-20 ENCOUNTER — Emergency Department (HOSPITAL_BASED_OUTPATIENT_CLINIC_OR_DEPARTMENT_OTHER)
Admission: EM | Admit: 2012-07-20 | Discharge: 2012-07-20 | Disposition: A | Discharge status: Home / Self Care | Payer: Medicare Other | Attending: Emergency Medicine | Admitting: Emergency Medicine

## 2012-07-20 ENCOUNTER — Encounter (HOSPITAL_BASED_OUTPATIENT_CLINIC_OR_DEPARTMENT_OTHER): Payer: Self-pay | Admitting: Emergency Medicine

## 2012-07-20 ENCOUNTER — Emergency Department (HOSPITAL_BASED_OUTPATIENT_CLINIC_OR_DEPARTMENT_OTHER): Payer: Medicare Other

## 2012-07-20 DIAGNOSIS — Z8679 Personal history of other diseases of the circulatory system: Secondary | ICD-10-CM | POA: Insufficient documentation

## 2012-07-20 DIAGNOSIS — Z87828 Personal history of other (healed) physical injury and trauma: Secondary | ICD-10-CM | POA: Insufficient documentation

## 2012-07-20 DIAGNOSIS — Z8669 Personal history of other diseases of the nervous system and sense organs: Secondary | ICD-10-CM | POA: Insufficient documentation

## 2012-07-20 DIAGNOSIS — Z79899 Other long term (current) drug therapy: Secondary | ICD-10-CM | POA: Insufficient documentation

## 2012-07-20 DIAGNOSIS — Z860100 Personal history of colon polyps, unspecified: Secondary | ICD-10-CM | POA: Insufficient documentation

## 2012-07-20 DIAGNOSIS — Z9861 Coronary angioplasty status: Secondary | ICD-10-CM | POA: Insufficient documentation

## 2012-07-20 DIAGNOSIS — Z8719 Personal history of other diseases of the digestive system: Secondary | ICD-10-CM | POA: Insufficient documentation

## 2012-07-20 DIAGNOSIS — F0781 Postconcussional syndrome: Secondary | ICD-10-CM | POA: Insufficient documentation

## 2012-07-20 DIAGNOSIS — E785 Hyperlipidemia, unspecified: Secondary | ICD-10-CM | POA: Insufficient documentation

## 2012-07-20 DIAGNOSIS — I10 Essential (primary) hypertension: Secondary | ICD-10-CM | POA: Insufficient documentation

## 2012-07-20 DIAGNOSIS — Z9889 Other specified postprocedural states: Secondary | ICD-10-CM | POA: Insufficient documentation

## 2012-07-20 DIAGNOSIS — F411 Generalized anxiety disorder: Secondary | ICD-10-CM | POA: Insufficient documentation

## 2012-07-20 DIAGNOSIS — Z87891 Personal history of nicotine dependence: Secondary | ICD-10-CM | POA: Insufficient documentation

## 2012-07-20 DIAGNOSIS — H5702 Anisocoria: Secondary | ICD-10-CM | POA: Insufficient documentation

## 2012-07-20 NOTE — ED Notes (Signed)
Pt here for recheck from [id number] from fall.  Pt continues to have intermittent  headaches.  Pt denies dizziness, no vision changes.  No N/v.Wyatt [name]

## 2012-07-20 NOTE — ED Provider Notes (Signed)
History     CSN: [id number]  Arrival date & time 07/20/12  1049   First MD Initiated Contact with Patient 07/20/12 1321      Chief Complaint  Patient presents with  . Follow-up    (Consider location/radiation/quality/duration/timing/severity/associated sxs/prior treatment) HPI This is an 79 year old male who fell on February 17 suffered a head injury. He was evaluated at that time and had a CT scan which was negative for acute injury. He was advised to return for a repeat CT should he develop worsening neurologic symptoms. He is here today because he has been having intermittent headaches. He describes the headaches as a vague, difficult to describe discomfort in his temples. It has worsened today while driving he became concerned and came here for reevaluation. He denies any focal neurologic deficit, nausea or vomiting. He has chronic anisocoria status post cataract surgery.  Past Medical History  Diagnosis Date  . Hypertension   . Hyperlipemia   . Atrial fibrillation   . Personal history of colonic polyps 04/2009    SERRATED ADENOMA  . Cardiac arrhythmia   . Diverticulosis   . Anxiety   . Cataract   . Lymphocytic colitis     Past Surgical History  Procedure Laterality Date  . Coronary angioplasty with stent placement    . Total knee arthroplasty      Family History  Problem Relation Age of Onset  . Colon cancer Neg Hx   . Liver cancer Father     History  Substance Use Topics  . Smoking status: Former Smoker    Quit date: 09/01/1980  . Smokeless tobacco: Never Used  . Alcohol Use: 0.0 oz/week     Comment: 2 daily       Review of Systems  All other systems reviewed and are negative.    Allergies  Ampicillin and Metronidazole  Home Medications   Current Outpatient Rx  Name  Route  Sig  Dispense  Refill  . atorvastatin (LIPITOR) 10 MG tablet   Oral   Take 10 mg by mouth daily. 1/2 tablet daily          . cyanocobalamin (,VITAMIN B-12,) 1000  MCG/ML injection   Intramuscular   Inject 1 mL (1,000 mcg total) into the muscle every 30 (thirty) days.   10 mL   3   . lisinopril (PRINIVIL,ZESTRIL) 5 MG tablet   Oral   Take 5 mg by mouth daily. Whole tablet in the morning and 1/2 tablet at night         . LORazepam (ATIVAN) 0.5 MG tablet   Oral   Take 0.5 mg by mouth as needed. Sleep/anxiety          . metoprolol tartrate (LOPRESSOR) 25 MG tablet   Oral   Take 25 mg by mouth 2 (two) times daily.           . Multiple Vitamin (MULTIVITAMIN) tablet   Oral   Take 1 tablet by mouth daily.           . SYRINGE-NEEDLE, DISP, 3 ML 24G X 1" 3 ML MISC      Use with b12 injections   12 each   0     BP 151/56  Pulse 58  Temp(Src) 98.8 F (37.1 C) (Oral)  Resp 18  Ht 5\' 8"  (1.727 m)  Wt 170 lb (77.111 kg)  BMI 25.85 kg/m2  SpO2 100%  Physical Exam General: Well-developed, well-nourished male in no acute distress; appearance consistent with  age of record HENT: normocephalic, atraumatic Eyes: pupils round and reactive to light, left about 1 mm larger than right; extraocular muscles intact Neck: supple Heart: regular rate and rhythm Lungs: clear to auscultation bilaterally Abdomen: soft; nondistended; nontender Extremities: No deformity; normal range of motion; pulses normal Neurologic: Awake, alert and oriented; motor function intact in all extremities and symmetric; no facial droop; normal coordination speech Skin: Warm and dry Psychiatric: Normal mood and affect    ED Course  Procedures (including critical care time)     MDM  Nursing notes and vitals signs, including pulse oximetry, reviewed.  Summary of this visit's results, reviewed by myself:  Imaging Studies: Ct Head Wo Contrast  07/20/2012  *RADIOLOGY REPORT*  Clinical Data: Fall, headaches  CT HEAD WITHOUT CONTRAST  Technique:  Contiguous axial images were obtained from the base of the skull through the vertex without contrast.  Comparison: Head  CT 07/12/2012  Findings: No intracranial hemorrhage.  No parenchymal contusion. No midline shift or mass effect.  Basilar cisterns are patent. No skull base fracture.  No fluid in the paranasal sinuses or mastoid air cells.  Generalized cortical atrophy and periventricular white matter hypodensities are similar to prior.  IMPRESSION: No intracranial trauma.  No change from prior.   Original Report Authenticated By: Genevive Bi, M.D.    Marland Kitchen        Hanley Seamen, MD 07/20/12 [phone]

## 2012-07-30 ENCOUNTER — Encounter: Payer: Self-pay | Admitting: *Deleted

## 2012-08-20 ENCOUNTER — Encounter: Payer: Self-pay | Admitting: Internal Medicine

## 2012-11-08 ENCOUNTER — Telehealth: Payer: Self-pay | Admitting: Internal Medicine

## 2012-11-08 NOTE — Telephone Encounter (Signed)
Pt is in [place] for the summer-Needs an MRI-they need his stent information,but he lost it in his wallet-can you get this information for her-they can not do MRI without this information!i

## 2012-11-08 NOTE — Telephone Encounter (Signed)
Returned call and spoke w/ pt's wife, Elease Hashimoto.  Informed there is no copy of card given to pt and pt was not established at this practice at the time the stent was placed.  Wife stated pt was being seen at [facility] and will give them a call.  Informed record does indicate pt had a stent placed in 1996.  Wife verbalized understanding and will fax ROI to medical records if unable to get information from [facility].

## 2013-02-10 ENCOUNTER — Other Ambulatory Visit: Payer: Self-pay | Admitting: *Deleted

## 2013-02-10 DIAGNOSIS — E785 Hyperlipidemia, unspecified: Secondary | ICD-10-CM

## 2013-02-10 MED ORDER — ATORVASTATIN CALCIUM 10 MG PO TABS: 10.0000 mg | ORAL_TABLET | Freq: Every day | ORAL | Status: DC

## 2013-02-16 ENCOUNTER — Ambulatory Visit: Payer: Medicare Other | Attending: Endocrinology | Admitting: Physical Therapy

## 2013-02-16 DIAGNOSIS — IMO0001 Reserved for inherently not codable concepts without codable children: Secondary | ICD-10-CM | POA: Insufficient documentation

## 2013-02-16 DIAGNOSIS — R262 Difficulty in walking, not elsewhere classified: Secondary | ICD-10-CM | POA: Insufficient documentation

## 2013-02-16 DIAGNOSIS — M6281 Muscle weakness (generalized): Secondary | ICD-10-CM | POA: Insufficient documentation

## 2013-02-21 ENCOUNTER — Ambulatory Visit: Payer: Medicare Other | Admitting: Physical Therapy

## 2013-02-23 ENCOUNTER — Ambulatory Visit: Payer: Medicare Other | Attending: Endocrinology | Admitting: Physical Therapy

## 2013-02-23 DIAGNOSIS — M6281 Muscle weakness (generalized): Secondary | ICD-10-CM | POA: Insufficient documentation

## 2013-02-23 DIAGNOSIS — R262 Difficulty in walking, not elsewhere classified: Secondary | ICD-10-CM | POA: Insufficient documentation

## 2013-02-23 DIAGNOSIS — IMO0001 Reserved for inherently not codable concepts without codable children: Secondary | ICD-10-CM | POA: Insufficient documentation

## 2013-02-28 ENCOUNTER — Ambulatory Visit: Payer: Medicare Other | Admitting: Physical Therapy

## 2013-03-01 ENCOUNTER — Ambulatory Visit (INDEPENDENT_AMBULATORY_CARE_PROVIDER_SITE_OTHER): Payer: Medicare Other | Admitting: Neurology

## 2013-03-01 ENCOUNTER — Encounter: Payer: Self-pay | Admitting: Neurology

## 2013-03-01 ENCOUNTER — Ambulatory Visit: Payer: Medicare Other | Admitting: Physical Therapy

## 2013-03-01 VITALS — BP 159/92 | HR 101 | Ht 65.5 in | Wt 157.0 lb

## 2013-03-01 DIAGNOSIS — I6381 Other cerebral infarction due to occlusion or stenosis of small artery: Secondary | ICD-10-CM

## 2013-03-01 DIAGNOSIS — E7849 Other hyperlipidemia: Secondary | ICD-10-CM

## 2013-03-01 DIAGNOSIS — E782 Mixed hyperlipidemia: Secondary | ICD-10-CM

## 2013-03-01 DIAGNOSIS — I10 Essential (primary) hypertension: Secondary | ICD-10-CM

## 2013-03-01 DIAGNOSIS — R269 Unspecified abnormalities of gait and mobility: Secondary | ICD-10-CM

## 2013-03-01 DIAGNOSIS — I6350 Cerebral infarction due to unspecified occlusion or stenosis of unspecified cerebral artery: Secondary | ICD-10-CM

## 2013-03-01 NOTE — Progress Notes (Addendum)
[facility] [address] [place]. [place] [address] [phone]       OFFICE CONSULT NOTE  Mr. MELDRICK BUTTERY Date of Birth:  [birth year] Medical Record Number:  [id number]   Referring MD: Adela Lank Reason for Referral:  Stroke HPI: Mr Schnitker is a 79 year old Caucasian male who had episode of sudden onset of dizziness, gait imbalance and walking like a drunk on 12/22/12 while admitted in the hospital in [place] for workup for a possible syncopal episode. He woke up from sleep and denied any vertigo or headache. He's noticed that he could not walk properly and his balance was off he has trouble coordinating his left side. CT head was unremarkable. He had an MRI scan of the brain done at  [facility] which I personally reviewed showed changes of small vessel disease but no acute infarct. MRA of the brain showed no significant large vessel and stenosis.He was seen by neurologist Dr Caroline More who felt he had a small brainstem or cerebellar infarct not visible on MRI due to small vessel disease. He has gotten his extensive medical records about his hospitalization and test results which I have personally reviewed. Carotid ultrasound showed no significant extraction stenosis. Transthoracic echo showed normal ejection fraction with a cardiac source of embolism. He was on baby aspirin which was continued. Vascular status also identified including hypertension hyperlipidemia and is continued on his medications. He was transferred for inpatient rehabilitation to [facility] in [facility] and was discharged a month ago. He is currently undergoing physical therapy as an outpatient and has not been steady improvement in and is now able to walk independently without any assistance. He has long standing history of mild short-term memory difficulties which have been unchanged over several years. He had a fall in January when he hit the back of his head without losing consciousness. Since  then he's been having intermittent headaches which he describes as bitemporal sharp shooting and transient. He was seen by a neurologist in [place] for this and underwent temporal artery biopsy which was negative. Headaches still continue but are getting better and last only 30 seconds. He takes Tylenol which seems to help. He has no known prior history of strokes or TIAs. He states his blood pressures are under good control usually in the 120s at home though it is slightly elevated at 159/102 in office today.  ROS:   14 system review of systems is positive for weight loss, fatigue, easy bruising, urination problems, importance, not enough sleep, headache, memory loss and insomnia.  PMH:  Past Medical History  Diagnosis Date  . Hypertension   . Hyperlipemia   . Atrial fibrillation   . Personal history of colonic polyps 04/2009    SERRATED ADENOMA  . Cardiac arrhythmia   . Diverticulosis   . Anxiety   . Cataract   . Lymphocytic colitis   . CAD (coronary artery disease)   . Inferior MI 2001    stent to RCA-Dr. Juanda Chance    Social History:  History   Social History  . Marital Status: Married    Spouse Name: N/A    Number of Children: 5  . Years of Education: college   Occupational History  . Retired    Social History Main Topics  . Smoking status: Former Smoker    Quit date: 09/01/1980  . Smokeless tobacco: Never Used  . Alcohol Use: 0.0 oz/week     Comment: 2 daily   . Drug Use: No  .  Sexual Activity: Not on file   Other Topics Concern  . Not on file   Social History Narrative  . No narrative on file    Medications:   Current Outpatient Prescriptions on File Prior to Visit  Medication Sig Dispense Refill  . aspirin 81 MG tablet Take 81 mg by mouth. 1 to 2 times per week      . atorvastatin (LIPITOR) 10 MG tablet Take 1 tablet (10 mg total) by mouth daily.  30 tablet  6  . metoprolol tartrate (LOPRESSOR) 25 MG tablet Take 25 mg by mouth 2 (two) times daily.        .  Multiple Vitamin (MULTIVITAMIN) tablet Take 1 tablet by mouth daily.         No current facility-administered medications on file prior to visit.    Allergies:   Allergies  Allergen Reactions  . Ampicillin     REACTION: unspecified  . Metronidazole     Burning skin and hands    Physical Exam General: well developed, well nourished elderly Caucasian, seated, in no evident distress Head: head normocephalic and atraumatic. Orohparynx benign Neck: supple with no carotid or supraclavicular bruits Cardiovascular: regular rate and rhythm, no murmurs Musculoskeletal: no deformity. Mild kyphoscoliosis Skin:  no rash/petichiae Vascular:  Normal pulses all extremities Filed Vitals:   03/01/13 1532  BP: 159/92  Pulse: 101    Neurologic Exam Mental Status: Awake and fully alert. Oriented to place and time. Recent and remote memory intact. Attention span, concentration and fund of knowledge appropriate. Mood and affect appropriate. Mini-Mental status exam score 25/30 with deficits in recall and drawing. Animal fluency test scored 11. Clock 4/4. Geriatric depression scale scored 7. Myrtis Ser index of activities of daily living scored 6 which is highly independent. Cranial Nerves: Fundoscopic exam reveals sharp disc margins. Pupils equal, briskly reactive to light. Extraocular movements full without nystagmus. Visual fields full to confrontation. Hearing diminished on the right. Facial sensation intact. Face, tongue, palate moves normally and symmetrically.  Motor: Normal bulk and tone. Normal strength in all tested extremity muscles. Sensory.: intact to touch and pinprick and vibratory sensation.  Coordination: Rapid alternating movements normal in all extremities. Finger-to-nose and heel-to-shin performed accurately bilaterally. Head shaking no dizziness or nystagmus. Fukuda stepping test moves off the base but no rotation. Hallpike maneuver not done Gait and Station: Arises from chair without  difficulty. Stance is normal. Gait demonstrates normal stride length and balance . Not able to heel, toe and tandem walk without difficulty.  Reflexes: 1+ and symmetric except ankle jerks are depressed. Toes downgoing.   NIHSS  0 Modified Rankin  1  ASSESSMENT: 63 year Caucasian male with episode of sudden onset dizziness and gait ataxia without any other lateralizing signs. thought  to be a small cerebellar infarct not visualized on MRI in July 2014. This occurred 2 days after admission for workup for her syncopal episode in [place]. Negative neurovascular workup and vascular risk factors of hypertension hyperlipidemia coronary artery disease. Mild age-related cognitive impairment which is stable for several years. Recurrent transient sharp bitemporal headaches with negative temporal artery biopsy    PLAN: Continue aspirin for stroke prevention and strict control of hypertension with blood pressure goal below 130/90 and lipids with LDL cholesterol goal below 100 mg percent. I have recommended that he can start driving but limit his driving to local driving and avoid long distance. Return for followup in 3 months or call earlier if necessary.

## 2013-03-01 NOTE — Patient Instructions (Addendum)
Continue aspirin for stroke prevention and strict control of hypertension with blood pressure goal below 130/90 and lipids with LDL cholesterol goal below 100 mg percent. I have recommended that he can start driving but limit his driving to local driving and avoid long distance. Return for followup in 3 months or call earlier if necessary.

## 2013-03-02 ENCOUNTER — Ambulatory Visit: Payer: Medicare Other | Admitting: Physical Therapy

## 2013-03-03 ENCOUNTER — Ambulatory Visit: Payer: Medicare Other | Admitting: Physical Therapy

## 2013-03-07 ENCOUNTER — Ambulatory Visit: Payer: Medicare Other | Admitting: Physical Therapy

## 2013-03-09 ENCOUNTER — Ambulatory Visit: Payer: Medicare Other | Admitting: Physical Therapy

## 2013-03-15 ENCOUNTER — Ambulatory Visit: Payer: Medicare Other | Admitting: Physical Therapy

## 2013-03-15 ENCOUNTER — Ambulatory Visit (INDEPENDENT_AMBULATORY_CARE_PROVIDER_SITE_OTHER): Payer: Medicare Other | Admitting: Cardiology

## 2013-03-15 ENCOUNTER — Encounter: Payer: Self-pay | Admitting: Cardiology

## 2013-03-15 VITALS — BP 130/50 | HR 97 | Ht 67.0 in | Wt 157.0 lb

## 2013-03-15 DIAGNOSIS — R5381 Other malaise: Secondary | ICD-10-CM

## 2013-03-15 DIAGNOSIS — I6381 Other cerebral infarction due to occlusion or stenosis of small artery: Secondary | ICD-10-CM

## 2013-03-15 DIAGNOSIS — I4949 Other premature depolarization: Secondary | ICD-10-CM

## 2013-03-15 DIAGNOSIS — I639 Cerebral infarction, unspecified: Secondary | ICD-10-CM

## 2013-03-15 DIAGNOSIS — I493 Ventricular premature depolarization: Secondary | ICD-10-CM | POA: Insufficient documentation

## 2013-03-15 DIAGNOSIS — I6350 Cerebral infarction due to unspecified occlusion or stenosis of unspecified cerebral artery: Secondary | ICD-10-CM

## 2013-03-15 DIAGNOSIS — R002 Palpitations: Secondary | ICD-10-CM

## 2013-03-15 DIAGNOSIS — R5383 Other fatigue: Secondary | ICD-10-CM

## 2013-03-15 DIAGNOSIS — R61 Generalized hyperhidrosis: Secondary | ICD-10-CM

## 2013-03-15 DIAGNOSIS — E871 Hypo-osmolality and hyponatremia: Secondary | ICD-10-CM

## 2013-03-15 DIAGNOSIS — I2510 Atherosclerotic heart disease of native coronary artery without angina pectoris: Secondary | ICD-10-CM

## 2013-03-15 NOTE — Assessment & Plan Note (Signed)
+   hx. PAF, in past refused anticoagulation but did take plavix.  Then he stopped plavix.  Is only on ASA.  Will check event monitor.

## 2013-03-15 NOTE — Assessment & Plan Note (Signed)
+   PVCs today will check BMP to eval for hypokalemia, will check TSH also.  Will have pt wear 4 week event monitor- eval for PAF burden and PVC burden.  If labs stable would stop verapamil and resume BB that was stopped in [place].  Pt and his wife are not sure why it was stopped.  He did have syncope on BB.

## 2013-03-15 NOTE — Assessment & Plan Note (Signed)
This summer while in [place], echo was done -pt's wife will bring records.  He refused to wear event monitor in [place] but not with bothersome palpitations.

## 2013-03-15 NOTE — Assessment & Plan Note (Addendum)
Mild rt sided chest discomfort resolves with belch, no significant chest pain. Last nuc 2011, no ischemia.  Hx of stent to RCA in 2001.

## 2013-03-15 NOTE — Patient Instructions (Signed)
Wear monitor for 3 weeks   Have lab done tomorrow 03/16/13  Follow up with Dr. Rennis Golden in 3-4 weeks for results.  If your labs are stable I will place you back on metoprolol instead of the verapamil.  Please bring copy of records from [facility],  Thank you.

## 2013-03-15 NOTE — Progress Notes (Addendum)
03/15/2013   PCP: Michiel Sites, MD   Chief Complaint  Patient presents with  . Heart Problem    stroke 12/20/12 in [place] then transferred back to [place], little chest pain  mid center, no sob, no edema, right leg a little weker since the CV    Primary Cardiologist: Dr. Rennis Golden  HPI:  79 year old white married male presents today secondary to palpitations. He has a history of hypertension, dyslipidemia, PAF and in 2001 in him I would stent to the RCA. He did have a TIA some time prior in January 2014 with some vision loss in the right eye.  Has a history of mild aortic stenosis and preserved EF. At one point patient was on Pradaxa but he refused to take that or warfarin was placed on Plavix with his aspirin which he tolerated but he discontinued the Plavix and did not wish to take it anymore. Previously has a history of memory loss which is somewhat worse since his recent CVA. He was last seen by Dr. Rennis Golden in January of this year at that time he was stable without significant complaints.  Today he called in and asked to be seen secondary to palpitations most of these he feels when he taken his pulse.  EKG does have frequent PACs, PVCs though there is concern for paroxysmal atrial fib. He and his wife tell me that he was in [place] had a syncopal episode and was taken to the hospital on the second day of the hospitalization he developed CVA. I have not been able to get the records from the [place] hospital.  His wife tells me he refused to have a cardionet monitor there to evaluate a fib burden. Dr. Pearlean Brownie mentions a 2-D echo in his note. His wife will bring a that echo so we can further evaluate the patient.    During the hospitalization this summer his beta blocker was stopped and was changed to verapamil I do not have the reasoning nor does the patient or his family.  Patient had an complaint of mild chest discomfort most of this is right-sided and is resolved with  belching.  Allergies  Allergen Reactions  . Ampicillin     REACTION: unspecified  . Metronidazole     Burning skin and hands    Current Outpatient Prescriptions  Medication Sig Dispense Refill  . aspirin 81 MG tablet Take 81 mg by mouth daily.       Marland Kitchen atorvastatin (LIPITOR) 10 MG tablet Take 1 tablet (10 mg total) by mouth daily.  30 tablet  6  . Multiple Vitamin (MULTIVITAMIN) tablet Take 1 tablet by mouth daily.        . predniSONE (DELTASONE) 2.5 MG tablet Take 2.5 mg by mouth daily. Take every other day      . sodium chloride 1 G tablet Take 1 g by mouth 2 (two) times daily.      . verapamil (CALAN) 120 MG tablet Take 120 mg by mouth daily.       No current facility-administered medications for this visit.    Past Medical History  Diagnosis Date  . Hypertension   . Hyperlipemia   . Atrial fibrillation   . Personal history of colonic polyps 04/2009    SERRATED ADENOMA  . Cardiac arrhythmia   . Diverticulosis   . Anxiety   . Cataract   . Lymphocytic colitis   . CAD (coronary artery disease)   . Inferior MI  2001    stent to RCA-Dr. Juanda Chance  . CVA (cerebral infarction)     while in [place]  . Syncope and collapse 2014    in [place] prior to the stroke by 24 hours.    Past Surgical History  Procedure Laterality Date  . Coronary angioplasty with stent placement  2001    Dr. Eden Emms  . Total knee arthroplasty      BMW:[id number]:NU colds or fevers, no weight changes Skin:no rashes or ulcers HEENT:no blurred vision, no congestion CV:see HPI PUL:see HPI GI:no diarrhea constipation or melena, no indigestion GU:no hematuria, no dysuria MS:no joint pain, no claudication Neuro:no syncope, no lightheadedness- see HPI Endo:no diabetes, no thyroid disease  PHYSICAL EXAM BP 130/50  Pulse 97  Ht 5\' 7"  (1.702 m)  Wt 157 lb (71.215 kg)  BMI 24.58 kg/m2 General:Pleasant affect, NAD, though brisk  Skin:Warm and dry, brisk capillary refill HEENT:normocephalic, sclera clear,  mucus membranes moist Neck:supple, no JVD, Lt carotid bruit, no adenopathy  Heart:S1S2 RRR with harsh aortic outflow murmur, no gallup, rub or click Lungs:clear without rales, rhonchi, or wheezes UVO:ZDGU, non tender, + BS, do not palpate liver spleen or masses Ext:no lower ext edema, 2+ pedal pulses, 2+ radial pulses Neuro:alert and oriented, MAE, follows commands, + facial symmetry, some memory issues  EKG: SR with freq PVCs, otherwise no acute changes.  ASSESSMENT AND PLAN Frequent PVCs + PVCs today will check BMP to eval for hypokalemia, will check TSH also.  Will have pt wear 4 week event monitor- eval for PAF burden and PVC burden.  If labs stable would stop verapamil and resume BB that was stopped in [place].  Pt and his wife are not sure why it was stopped.  He did have syncope on BB.    Palpitations + hx. PAF, in past refused anticoagulation but did take plavix.  Then he stopped plavix.  Is only on ASA.  Will check event monitor.  Acute lacunar infarction This summer while in [place], echo was done -pt's wife will bring records.  He refused to wear event monitor in [place] but not with bothersome palpitations.    CAD (coronary artery disease) Mild rt sided chest discomfort resolves with belch, no significant chest pain. Last nuc 2011, no ischemia.  Hx of stent to RCA in 2001.   Patient has mild carotid artery disease and with his recent CVA these were probably evaluated in [place] we will wait to get the records for further treatment plan.

## 2013-03-17 ENCOUNTER — Telehealth: Payer: Self-pay | Admitting: Internal Medicine

## 2013-03-17 ENCOUNTER — Ambulatory Visit: Payer: Medicare Other | Admitting: Physical Therapy

## 2013-03-17 NOTE — Telephone Encounter (Signed)
New onset Afib.  Asymptomatic. 162 bpm intermittent ~40 secs.  Will fax report.  Message forwarded to Dr. Rennis Golden.  Report will be placed on Dr. Blanchie Dessert cart for review when received.

## 2013-03-17 NOTE — Telephone Encounter (Signed)
Message sent to scheduler - per Dr. Rennis Golden - to set up office visit.

## 2013-03-17 NOTE — Telephone Encounter (Signed)
He has known PAF -  Refused to take anticoagulation, then had a stroke in [place].  Now he has even more reason to take anticoagulation.  He was previously on pradaxa and apparently still is having a-fib.  I would recommend he starts on stronger anticoagulation such as Xarelto.  Please make an appointment for me to see him so that we can discuss.  -Dr. Rennis Golden

## 2013-03-18 ENCOUNTER — Encounter: Payer: Self-pay | Admitting: Internal Medicine

## 2013-03-21 ENCOUNTER — Ambulatory Visit: Payer: Medicare Other | Admitting: Physical Therapy

## 2013-03-24 ENCOUNTER — Ambulatory Visit: Payer: Medicare Other | Admitting: Physical Therapy

## 2013-03-28 ENCOUNTER — Encounter: Payer: Self-pay | Admitting: Internal Medicine

## 2013-03-28 ENCOUNTER — Ambulatory Visit (INDEPENDENT_AMBULATORY_CARE_PROVIDER_SITE_OTHER): Payer: Medicare Other | Admitting: Internal Medicine

## 2013-03-28 VITALS — BP 138/70 | HR 72 | Ht 67.0 in | Wt 161.8 lb

## 2013-03-28 DIAGNOSIS — E871 Hypo-osmolality and hyponatremia: Secondary | ICD-10-CM

## 2013-03-28 DIAGNOSIS — I493 Ventricular premature depolarization: Secondary | ICD-10-CM

## 2013-03-28 DIAGNOSIS — I480 Paroxysmal atrial fibrillation: Secondary | ICD-10-CM | POA: Insufficient documentation

## 2013-03-28 DIAGNOSIS — I4949 Other premature depolarization: Secondary | ICD-10-CM

## 2013-03-28 DIAGNOSIS — R002 Palpitations: Secondary | ICD-10-CM

## 2013-03-28 DIAGNOSIS — E782 Mixed hyperlipidemia: Secondary | ICD-10-CM

## 2013-03-28 DIAGNOSIS — I4892 Unspecified atrial flutter: Secondary | ICD-10-CM | POA: Insufficient documentation

## 2013-03-28 DIAGNOSIS — E7849 Other hyperlipidemia: Secondary | ICD-10-CM

## 2013-03-28 DIAGNOSIS — I4891 Unspecified atrial fibrillation: Secondary | ICD-10-CM

## 2013-03-28 DIAGNOSIS — R55 Syncope and collapse: Secondary | ICD-10-CM | POA: Insufficient documentation

## 2013-03-28 DIAGNOSIS — I2510 Atherosclerotic heart disease of native coronary artery without angina pectoris: Secondary | ICD-10-CM

## 2013-03-28 MED ORDER — METOPROLOL TARTRATE 25 MG PO TABS: 25.0000 mg | ORAL_TABLET | Freq: Two times a day (BID) | ORAL | Status: DC

## 2013-03-28 MED ORDER — COUMADIN 5 MG PO TABS: 5.0000 mg | ORAL_TABLET | Freq: Every day | ORAL | Status: DC

## 2013-03-28 NOTE — Progress Notes (Signed)
OFFICE NOTE  Chief Complaint:  Follow-up hospitalization in [place]  Primary Care Physician: Wyatt Sites, MD  HPI:  Wyatt [name] is an 79 year old gentleman from [place] originally with history of hypertension, dyslipidemia, paroxysmal A-fib and an MI with a stent to the right coronary in 2001. Recently had a TIA with some vision loss in the right eye. He has some mild aortic stenosis with peak and mean gradients of 22 and 10 mmHg. His EF has been reserved. He was previously on Pradaxa but refused to take that or warfarin and has been on Plavix and aspirin which he seems to be able to tolerate. However, recently he has discontinued the Plavix and is only taking aspirin once or twice per week. In fact, he is not taking many medications including Lipitor which he stopped which he said was worsening his memory loss. He was also started on Namenda, however, stopped taking that as well. At this point, he seems to be content not taking medications although he is at risk for future strokes and/or worsening heart failure. I counseled him extensively about his risk for stroke and my concern for possible atrial fibrillation. He refused to wear a monitor in the past. Unfortunately, he was recently hospitalized in [place] with a presumptive diagnosis of stroke. He did have a syncopal episode, and imaging studies demonstrated bilateral cerebral small vessel disease. He was noted to be hyponatremic.  He had no cardiac monitoring at that time. He is also felt to have acute gout and was placed on steroids which improved his symptoms. Apparently he was unresponsive at home and had collapsed. There is no evidence for cardiac dysrhythmia noted during the hospital stay however he was hyponatremic with a sodium of 125. He refused Holter monitoring at discharge. Ultimately he was sent to rehabilitation in [place] which he completed in 3 weeks.  Since that time I spoken with his wife who noted that he was  complaining more of palpitations. Ultimately he was agreeable to wearing a Holter monitor. The monitor demonstrated new onset atrial flutter at a rate of 162. There were also episodes of atrial fibrillation, PVCs and PSVT which was 10 beats on 03/18/2013. He notes that he does continue to have these episodes.  He was previously on a beta blocker, however this was discontinued by him along with other medications. He does appear to be more depressed today he reports that he actually feels less interested in doing the things he typically would do. He used to play golf frequently and notes that now he has no interest and it anymore and feels that his body is "giving up on him".  PMHx:  Past Medical History  Diagnosis Date  . Hypertension   . Hyperlipemia   . Atrial fibrillation   . Personal history of colonic polyps 04/2009    SERRATED ADENOMA  . Cardiac arrhythmia   . Diverticulosis   . Anxiety   . Cataract   . Lymphocytic colitis   . CAD (coronary artery disease)   . Inferior MI 2001    stent to RCA-Dr. Juanda Chance  . CVA (cerebral infarction)     while in [place]  . Syncope and collapse 2014    in [place] prior to the stroke by 24 hours.    Past Surgical History  Procedure Laterality Date  . Coronary angioplasty with stent placement  2001    Dr. Eden Emms  . Total knee arthroplasty      FAMHx:  Family History  Problem Relation Age of Onset  . Colon cancer Neg Hx   . Liver cancer Father   . Cancer Sister   . Cancer Sister     SOCHx:   reports that he quit smoking about 32 years ago. He has never used smokeless tobacco. He reports that he drinks alcohol. He reports that he does not use illicit drugs.  ALLERGIES:  Allergies  Allergen Reactions  . Ampicillin     REACTION: unspecified  . Metronidazole     Burning skin and hands    ROS: A comprehensive review of systems was negative except for: Constitutional: positive for fatigue and weight loss Cardiovascular: positive for  irregular heart beat and palpitations Neurological: positive for stroke  HOME MEDS: Current Outpatient Prescriptions  Medication Sig Dispense Refill  . aspirin 81 MG tablet Take 81 mg by mouth daily.       Marland Kitchen atorvastatin (LIPITOR) 10 MG tablet Take 1 tablet (10 mg total) by mouth daily.  30 tablet  6  . LORazepam (ATIVAN) 0.5 MG tablet Take 0.5 mg by mouth as needed for anxiety.      . Multiple Vitamin (MULTIVITAMIN) tablet Take 1 tablet by mouth daily.        . sodium chloride 1 G tablet Take 1 g by mouth 2 (two) times daily.      . verapamil (CALAN) 120 MG tablet Take 120 mg by mouth daily.      Marland Kitchen COUMADIN 5 MG tablet Take 1 tablet (5 mg total) by mouth daily.  30 tablet  1  . metoprolol tartrate (LOPRESSOR) 25 MG tablet Take 1 tablet (25 mg total) by mouth 2 (two) times daily.  180 tablet  3   No current facility-administered medications for this visit.    LABS/IMAGING: No results found for this or any previous visit (from the past 48 hour(s)). No results found.  VITALS: BP 138/70  Pulse 72  Ht 5\' 7"  (1.702 m)  Wt 161 lb 12.8 oz (73.392 kg)  BMI 25.34 kg/m2  EXAM: General appearance: alert and no distress Neck: no carotid bruit and no JVD Lungs: clear to auscultation bilaterally Heart: regular rate and rhythm, S1, S2 normal and systolic murmur: midsystolic 3/6, crescendo at 2nd right intercostal space Abdomen: soft, non-tender; bowel sounds normal; no masses,  no organomegaly Extremities: extremities normal, atraumatic, no cyanosis or edema Pulses: 2+ and symmetric Skin: Skin color, texture, turgor normal. No rashes or lesions Neurologic: Grossly normal Psych: Mood, affect normal  EKG: Normal sinus rhythm at 72  ASSESSMENT: 1. Paroxysmal atrial fibrillation/flutter 2. Nonsustained VT and PVCs 3. Syncope/recent stroke 4. History of TIAs 5. History of medication noncompliance  PLAN: 1.   [name] unfortunately had another event which was either syncope or  possibly stroke. The neurologic exam was somewhat confusing to me, and imaging did not clearly show new stroke although there is a history of small vessel disease. There were no documented arrhythmias during his hospitalization, but he is clearly having arrhythmias now. There is atrial fibrillation which was documented as well as a short period of nonsustained VT. He reports that he is aware of palpitations, but denies chest pain. We had a long discussion again about the importance of anticoagulation which I previously recommended prior to his recent episode. He is now agreeable at the behest of his wife to starting on Coumadin. I think he can discontinue his aspirin while he is on Coumadin. In addition, he will require reinitiation of his beta blocker  to suppress some of the nonsustained VT, PVCs and control his atrial rate. I recommend 25 mg of metoprolol tartrate twice daily.  I'll plan to see him back in a month to see these tolerating his current medications.  I do believe he is depressed, and should strongly consider psychiatric evaluation.  Chrystie Nose, MD, [facility] Attending Cardiologist [facility]  Arda Daggs C 03/28/2013, 1:08 PM

## 2013-03-28 NOTE — Patient Instructions (Addendum)
Your physician recommends that you schedule a follow-up appointment in: 1 month.  START taking coumadin daily. START taking metoprolol tartrate 25mg  twice daily.  STOP aspirin.   Please schedule an appointment with Belenda Cruise on Monday 11/10.

## 2013-03-30 ENCOUNTER — Telehealth: Payer: Self-pay | Admitting: *Deleted

## 2013-03-30 DIAGNOSIS — I639 Cerebral infarction, unspecified: Secondary | ICD-10-CM

## 2013-03-30 NOTE — Telephone Encounter (Signed)
Pt going out of town, needs to have first INR Monday, will be in [place].  Gave paperwork for wife to p/u Thursday, will have drawn there.  Call back # for [place] is [phone]

## 2013-03-30 NOTE — Telephone Encounter (Signed)
Pt's wife called and wanted to R/S his coumadin appointment due to some traveling. She asked that you call her back bc she has a question about it.

## 2013-03-31 ENCOUNTER — Telehealth: Payer: Self-pay | Admitting: Pharmacist Clinician (PhC)/ Clinical Pharmacy Specialist

## 2013-03-31 NOTE — Telephone Encounter (Signed)
Opened in error

## 2013-04-04 ENCOUNTER — Ambulatory Visit: Payer: Medicare Other | Admitting: Pharmacist Clinician (PhC)/ Clinical Pharmacy Specialist

## 2013-04-11 ENCOUNTER — Ambulatory Visit (INDEPENDENT_AMBULATORY_CARE_PROVIDER_SITE_OTHER): Payer: Medicare Other | Admitting: Pharmacist Clinician (PhC)/ Clinical Pharmacy Specialist

## 2013-04-11 VITALS — BP 132/76 | HR 72

## 2013-04-11 DIAGNOSIS — I480 Paroxysmal atrial fibrillation: Secondary | ICD-10-CM

## 2013-04-11 DIAGNOSIS — Z7901 Long term (current) use of anticoagulants: Secondary | ICD-10-CM

## 2013-04-11 DIAGNOSIS — I4891 Unspecified atrial fibrillation: Secondary | ICD-10-CM

## 2013-04-11 LAB — POCT INR: INR: 2.5

## 2013-04-14 ENCOUNTER — Ambulatory Visit: Payer: Medicare Other | Admitting: Internal Medicine

## 2013-04-25 ENCOUNTER — Ambulatory Visit: Payer: Medicare Other | Admitting: Pharmacist Clinician (PhC)/ Clinical Pharmacy Specialist

## 2013-04-26 ENCOUNTER — Encounter: Payer: Self-pay | Admitting: Internal Medicine

## 2013-04-26 ENCOUNTER — Ambulatory Visit (INDEPENDENT_AMBULATORY_CARE_PROVIDER_SITE_OTHER): Payer: Medicare Other | Admitting: Pharmacist Clinician (PhC)/ Clinical Pharmacy Specialist

## 2013-04-26 ENCOUNTER — Ambulatory Visit (INDEPENDENT_AMBULATORY_CARE_PROVIDER_SITE_OTHER): Payer: Medicare Other | Admitting: Internal Medicine

## 2013-04-26 VITALS — BP 148/78 | HR 52 | Ht 67.5 in | Wt 162.0 lb

## 2013-04-26 DIAGNOSIS — I480 Paroxysmal atrial fibrillation: Secondary | ICD-10-CM

## 2013-04-26 DIAGNOSIS — I10 Essential (primary) hypertension: Secondary | ICD-10-CM

## 2013-04-26 DIAGNOSIS — I4891 Unspecified atrial fibrillation: Secondary | ICD-10-CM

## 2013-04-26 DIAGNOSIS — Z7901 Long term (current) use of anticoagulants: Secondary | ICD-10-CM

## 2013-04-26 DIAGNOSIS — I4949 Other premature depolarization: Secondary | ICD-10-CM

## 2013-04-26 DIAGNOSIS — I4892 Unspecified atrial flutter: Secondary | ICD-10-CM

## 2013-04-26 DIAGNOSIS — I2510 Atherosclerotic heart disease of native coronary artery without angina pectoris: Secondary | ICD-10-CM

## 2013-04-26 DIAGNOSIS — I493 Ventricular premature depolarization: Secondary | ICD-10-CM

## 2013-04-26 LAB — POCT INR: INR: 1.7

## 2013-04-26 MED ORDER — LISINOPRIL 10 MG PO TABS: 10.0000 mg | ORAL_TABLET | Freq: Every day | ORAL | Status: DC

## 2013-04-26 NOTE — Patient Instructions (Signed)
Your physician wants you to follow-up in: 6 months. You will receive a reminder letter in the mail two months in advance. If you don't receive a letter, please call our office to schedule the follow-up appointment.  Start lisinopril 10mg  once daily - this has been sent to your pharmacy.

## 2013-04-26 NOTE — Progress Notes (Signed)
OFFICE NOTE  Chief Complaint:  Follow-up hospitalization in [place]  Primary Care Physician: Michiel Sites, MD  HPI:  Wyatt [name] is an 79 year old gentleman from [place] originally with history of hypertension, dyslipidemia, paroxysmal A-fib and an MI with a stent to the right coronary in 2001. Recently had a TIA with some vision loss in the right eye. He has some mild aortic stenosis with peak and mean gradients of 22 and 10 mmHg. His EF has been reserved. He was previously on Pradaxa but refused to take that or warfarin and has been on Plavix and aspirin which he seems to be able to tolerate. However, recently he has discontinued the Plavix and is only taking aspirin once or twice per week. In fact, he is not taking many medications including Lipitor which he stopped which he said was worsening his memory loss. He was also started on Namenda, however, stopped taking that as well. At this point, he seems to be content not taking medications although he is at risk for future strokes and/or worsening heart failure. I counseled him extensively about his risk for stroke and my concern for possible atrial fibrillation. He refused to wear a monitor in the past. Unfortunately, he was recently hospitalized in [place] with a presumptive diagnosis of stroke. He did have a syncopal episode, and imaging studies demonstrated bilateral cerebral small vessel disease. He was noted to be hyponatremic.  He had no cardiac monitoring at that time. He is also felt to have acute gout and was placed on steroids which improved his symptoms. Apparently he was unresponsive at home and had collapsed. There is no evidence for cardiac dysrhythmia noted during the hospital stay however he was hyponatremic with a sodium of 125. He refused Holter monitoring at discharge. Ultimately he was sent to rehabilitation in [place] which he completed in 3 weeks.  Since that time I spoken with his wife who noted that he was  complaining more of palpitations. Ultimately he was agreeable to wearing a Holter monitor. The monitor demonstrated new onset atrial flutter at a rate of 162. There were also episodes of atrial fibrillation, PVCs and PSVT which was 10 beats on 03/18/2013. He notes that he does continue to have these episodes.  He was previously on a beta blocker, however this was discontinued by him along with other medications. He does appear to be more depressed today he reports that he actually feels less interested in doing the things he typically would do. He used to play golf frequently and notes that now he has no interest and it anymore and feels that his body is "giving up on him".  Wyatt [name] returns today and as actually feeling quite well. He has been taking his beta blocker twice daily and generally feels a marked improvement in his palpitations. He occasionally has some mild breakthrough around the early afternoon, but it is generally well controlled. His heart rate is coming down about 8 points since his last office visit. Unfortunately he stopped taking the verapamil and his blood pressure is running slightly higher than it had been in the past.  He has been taking warfarin without any complications. His INR in the office today was 1.7  PMHx:  Past Medical History  Diagnosis Date  . Hypertension   . Hyperlipemia   . Atrial fibrillation   . Personal history of colonic polyps 04/2009    SERRATED ADENOMA  . Cardiac arrhythmia   . Diverticulosis   . Anxiety   .  Cataract   . Lymphocytic colitis   . CAD (coronary artery disease)   . Inferior MI 2001    stent to RCA-Dr. Juanda Chance  . CVA (cerebral infarction)     while in [place]  . Syncope and collapse 2014    in [place] prior to the stroke by 24 hours.    Past Surgical History  Procedure Laterality Date  . Coronary angioplasty with stent placement  2001    Dr. Eden Emms  . Total knee arthroplasty      FAMHx:  Family History  Problem Relation Age  of Onset  . Colon cancer Neg Hx   . Liver cancer Father   . Cancer Sister   . Cancer Sister     SOCHx:   reports that he quit smoking about 32 years ago. He has never used smokeless tobacco. He reports that he drinks alcohol. He reports that he does not use illicit drugs.  ALLERGIES:  Allergies  Allergen Reactions  . Ampicillin     REACTION: unspecified  . Metronidazole     Burning skin and hands    ROS: A comprehensive review of systems was negative except for: Constitutional: positive for fatigue and weight loss Cardiovascular: positive for irregular heart beat and palpitations Neurological: positive for stroke  HOME MEDS: Current Outpatient Prescriptions  Medication Sig Dispense Refill  . aspirin 81 MG tablet Take 81 mg by mouth daily.       Marland Kitchen atorvastatin (LIPITOR) 10 MG tablet Take 1 tablet (10 mg total) by mouth daily.  30 tablet  6  . COUMADIN 5 MG tablet Take 1 tablet (5 mg total) by mouth daily.  30 tablet  1  . LORazepam (ATIVAN) 0.5 MG tablet Take 0.5 mg by mouth as needed for anxiety.      . meloxicam (MOBIC) 15 MG tablet Take 15 mg by mouth daily as needed.       . metoprolol tartrate (LOPRESSOR) 25 MG tablet Take 1 tablet (25 mg total) by mouth 2 (two) times daily.  180 tablet  3  . Multiple Vitamin (MULTIVITAMIN) tablet Take 1 tablet by mouth daily.        . sodium chloride 1 G tablet Take 1 g by mouth 2 (two) times daily.      Marland Kitchen lisinopril (PRINIVIL,ZESTRIL) 10 MG tablet Take 1 tablet (10 mg total) by mouth daily.  90 tablet  3   No current facility-administered medications for this visit.    LABS/IMAGING: No results found for this or any previous visit (from the past 48 hour(s)). No results found.  VITALS: BP 148/78  Pulse 52  Ht 5' 7.5" (1.715 m)  Wt 162 lb (73.483 kg)  BMI 24.98 kg/m2  EXAM: General appearance: alert and no distress Neck: no carotid bruit and no JVD Lungs: clear to auscultation bilaterally Heart: regular rate and rhythm, S1,  S2 normal and systolic murmur: midsystolic 3/6, crescendo at 2nd right intercostal space Abdomen: soft, non-tender; bowel sounds normal; no masses,  no organomegaly Extremities: extremities normal, atraumatic, no cyanosis or edema Pulses: 2+ and symmetric Skin: Skin color, texture, turgor normal. No rashes or lesions Neurologic: Grossly normal Psych: Mood, affect normal  EKG: Normal sinus rhythm at 72  ASSESSMENT: 1. Paroxysmal atrial fibrillation/flutter 2. Nonsustained VT and PVCs 3. Syncope/recent stroke 4. History of TIAs 5. History of medication noncompliance  PLAN: 1.   Wyatt [name] is now taking warfarin without any complications. His INR was 1.7 today and adjusted by Belenda Cruise her anticoagulation  pharmacist. He reports marked improvement in his PVCs, and paroxysmal atrial fibrillation/flutter on metoprolol. I wish to continue him on this at its current dose as there is little room to increase it. He does have occasional breakthrough but it is minimal and short-lived. His blood pressure is slightly higher than normal, and he previously was on lisinopril. I would like to restart that again today at 10 mg. He has discontinued verapamil. Plan to see him back in 6 months or sooner as necessary.  Chrystie Nose, MD, [facility] Attending Cardiologist [facility]  Andree Heeg C 04/26/2013, 11:38 AM

## 2013-05-10 ENCOUNTER — Ambulatory Visit (INDEPENDENT_AMBULATORY_CARE_PROVIDER_SITE_OTHER): Payer: Medicare Other | Admitting: Pharmacist Clinician (PhC)/ Clinical Pharmacy Specialist

## 2013-05-10 VITALS — BP 130/60 | HR 60

## 2013-05-10 DIAGNOSIS — I480 Paroxysmal atrial fibrillation: Secondary | ICD-10-CM

## 2013-05-10 DIAGNOSIS — I4891 Unspecified atrial fibrillation: Secondary | ICD-10-CM

## 2013-05-10 DIAGNOSIS — Z7901 Long term (current) use of anticoagulants: Secondary | ICD-10-CM

## 2013-05-10 LAB — POCT INR: INR: 1.8

## 2013-05-10 MED ORDER — COUMADIN 5 MG PO TABS: ORAL_TABLET | ORAL | Status: DC

## 2013-05-12 ENCOUNTER — Telehealth: Payer: Self-pay | Admitting: Neurology

## 2013-05-12 NOTE — Telephone Encounter (Signed)
Informed patient of Dr Marlis Edelson note, will set up sooner appt for patient

## 2013-05-12 NOTE — Telephone Encounter (Signed)
Patient's wife called and states that patient has had 3 episodes where he experienced blurred speech and eye blurring on last Friday and Saturday. Patient's wife states that this morning he said he was not feeling right and was experiencing dizziness. Patient's wife is wondering if he needs to be seen sooner than his scheduled 06/06/13 appointment. Please call the patient's wife.

## 2013-05-12 NOTE — Telephone Encounter (Signed)
Needs Ct head wo  Ordered and work in appointment with Lynn/ me next week

## 2013-05-13 NOTE — Telephone Encounter (Signed)
ok 

## 2013-05-16 ENCOUNTER — Encounter: Payer: Self-pay | Admitting: Neurology

## 2013-05-16 ENCOUNTER — Ambulatory Visit (INDEPENDENT_AMBULATORY_CARE_PROVIDER_SITE_OTHER): Payer: Medicare Other | Admitting: Neurology

## 2013-05-16 ENCOUNTER — Encounter (INDEPENDENT_AMBULATORY_CARE_PROVIDER_SITE_OTHER): Payer: Self-pay

## 2013-05-16 VITALS — BP 174/82 | HR 60 | Ht 66.0 in | Wt 161.0 lb

## 2013-05-16 DIAGNOSIS — R42 Dizziness and giddiness: Secondary | ICD-10-CM

## 2013-05-16 DIAGNOSIS — I4891 Unspecified atrial fibrillation: Secondary | ICD-10-CM

## 2013-05-16 DIAGNOSIS — G459 Transient cerebral ischemic attack, unspecified: Secondary | ICD-10-CM

## 2013-05-16 MED ORDER — APIXABAN 2.5 MG PO TABS: 2.5000 mg | ORAL_TABLET | Freq: Two times a day (BID) | ORAL | Status: DC

## 2013-05-16 NOTE — Progress Notes (Signed)
[facility] [address] [place]. [place] [address] [phone]       OFFICE CONSULT NOTE  Wyatt [name] Date of Birth:  10-20-1928 Medical Record Number:  [id number]   Referring MD: Adela Lank Reason for Referral:  Stroke HPI: He returns for followup today after loss as a 03/01/13. He seemed today urgently as a revisit upon request from his wife. He has had at least 3 episodes of transient speech disturbance as well as confusion lasting few minutes since her last visit. The patient tries to speak words are not clear he can barely be understood and has more trouble expressing himself. He during one of these episodes the wife noticed that he could not tell the time clearly on his watch and was little confused. He has also been complaining of dizziness which describes as room spinning which occurs only when he bends his head down and the feeling is transient and disappears when he looks up. This has been occurring intermittently but does not occur every time. He denies any ear pain, ringing in the ears or decreased hearing. He has had previous history of transient vertigo on 3-4 occasions over the last 20 years. He has never been evaluated or prescribe any vestibular stabilization exercises. He has been started on meclizine recently by his primary physician and feels that may be helping him. He states his memory difficulties are unchanged and he has had long-standing minor memory loss for several years which does not appear to be getting any worse. Initial Consult 03/01/13 Wyatt [name] is a 79 year old Caucasian male who had episode of sudden onset of dizziness, gait imbalance and walking like a drunk on 12/22/12 while admitted in the hospital in [place] for workup for a possible syncopal episode. He woke up from sleep and denied any vertigo or headache. He's noticed that he could not walk properly and his balance was off he has trouble coordinating his left side. CT head was  unremarkable. He had an MRI scan of the brain done at  [facility] which I personally reviewed showed changes of small vessel disease but no acute infarct. MRA of the brain showed no significant large vessel and stenosis.He was seen by neurologist Dr Caroline More who felt he had a small brainstem or cerebellar infarct not visible on MRI due to small vessel disease. He has gotten his extensive medical records about his hospitalization and test results which I have personally reviewed. Carotid ultrasound showed no significant extraction stenosis. Transthoracic echo showed normal ejection fraction with a cardiac source of embolism. He was on baby aspirin which was continued. Vascular status also identified including hypertension hyperlipidemia and is continued on his medications. He was transferred for inpatient rehabilitation to [facility] in [facility] and was discharged a month ago. He is currently undergoing physical therapy as an outpatient and has not been steady improvement in and is now able to walk independently without any assistance. He has long standing history of mild short-term memory difficulties which have been unchanged over several years. He had a fall in January when he hit the back of his head without losing consciousness. Since then he's been having intermittent headaches which he describes as bitemporal sharp shooting and transient. He was seen by a neurologist in [place] for this and underwent temporal artery biopsy which was negative. Headaches still continue but are getting better and last only 30 seconds. He takes Tylenol which seems to help. He has no known prior history of strokes or  TIAs. He states his blood pressures are under good control usually in the 120s at home though it is slightly elevated at 159/102 in office today.  ROS:   14 system review of systems is positive dizziness, memory loss, speech difficulty only and all other systems negative  PMH:  Past Medical History   Diagnosis Date  . Hypertension   . Hyperlipemia   . Atrial fibrillation   . Personal history of colonic polyps 04/2009    SERRATED ADENOMA  . Cardiac arrhythmia   . Diverticulosis   . Anxiety   . Cataract   . Lymphocytic colitis   . CAD (coronary artery disease)   . Inferior MI 2001    stent to RCA-Dr. Juanda Chance  . CVA (cerebral infarction)     while in [place]  . Syncope and collapse 2014    in [place] prior to the stroke by 24 hours.    Social History:  History   Social History  . Marital Status: Married    Spouse Name: N/A    Number of Children: 5  . Years of Education: college   Occupational History  . Retired    Social History Main Topics  . Smoking status: Former Smoker    Quit date: 09/01/1980  . Smokeless tobacco: Never Used  . Alcohol Use: 0.0 oz/week  . Drug Use: No  . Sexual Activity: Not on file   Other Topics Concern  . Not on file   Social History Narrative  . No narrative on file    Medications:   Current Outpatient Prescriptions on File Prior to Visit  Medication Sig Dispense Refill  . atorvastatin (LIPITOR) 10 MG tablet Take 1 tablet (10 mg total) by mouth daily.  30 tablet  6  . lisinopril (PRINIVIL,ZESTRIL) 10 MG tablet Take 1 tablet (10 mg total) by mouth daily.  90 tablet  3  . LORazepam (ATIVAN) 0.5 MG tablet Take 0.5 mg by mouth as needed for anxiety.      . metoprolol tartrate (LOPRESSOR) 25 MG tablet Take 1 tablet (25 mg total) by mouth 2 (two) times daily.  180 tablet  3  . Multiple Vitamin (MULTIVITAMIN) tablet Take 1 tablet by mouth daily.        . sodium chloride 1 G tablet Take 1 g by mouth 2 (two) times daily.       No current facility-administered medications on file prior to visit.    Allergies:   Allergies  Allergen Reactions  . Ampicillin     REACTION: unspecified  . Metronidazole     Burning skin and hands    Physical Exam General: well developed, well nourished elderly Caucasian, seated, in no evident  distress Head: head normocephalic and atraumatic. Orohparynx benign Neck: supple with no carotid or supraclavicular bruits Cardiovascular: regular rate and rhythm, no murmurs Musculoskeletal: no deformity. Mild kyphoscoliosis Skin:  no rash/petichiae Vascular:  Normal pulses all extremities Filed Vitals:   05/16/13 1338  BP: 174/82  Pulse: 60    Neurologic Exam Mental Status: Awake and fully alert. Oriented to place and time. Recent and remote memory intact. Attention span, concentration and fund of knowledge appropriate. Mood and affect appropriate. Mini-Mental status exam not done today Cranial Nerves: Fundoscopic exam not done.   Pupils equal, briskly reactive to light. Extraocular movements full without nystagmus. Visual fields full to confrontation. Hearing diminished on the right. Facial sensation intact. Face, tongue, palate moves normally and symmetrically.  Motor: Normal bulk and tone. Normal strength in  all tested extremity muscles. Sensory.: intact to touch and pinprick and vibratory sensation.  Coordination: Rapid alternating movements normal in all extremities. Finger-to-nose and heel-to-shin performed accurately bilaterally. Head shaking no dizziness or nystagmus. Fukuda stepping test moves off the base but no rotation. Hallpike maneuver not done Gait and Station: Arises from chair without difficulty. Stance is normal. Gait demonstrates normal stride length and balance . Not able to heel, toe and tandem walk without difficulty.  Reflexes: 1+ and symmetric except ankle jerks are depressed. Toes downgoing.   NIHSS  0 Modified Rankin  1  ASSESSMENT: 47 year Caucasian male with episode of sudden onset dizziness and gait ataxia without any other lateralizing signs. thought  to be a small cerebellar infarct not visualized on MRI in July 2014. This occurred 2 days after admission for workup for her syncopal episode in [place]. Negative neurovascular workup and vascular risk factors of  hypertension hyperlipidemia coronary artery disease. Mild age-related cognitive impairment which is stable for several years. Recurrent transient sharp bitemporal headaches with negative temporal artery biopsy Paroxysmal atrial fibrillation/flutter-started on warfarin recently by Dr Rennis Golden but has had recurrent posterior circulation TIAs despite anticoagulation    PLAN: Continue  strict control of hypertension with blood pressure goal below 130/90 and lipids with LDL cholesterol goal below 100 mg percent.Discontinue warfarin and change to  Eliquis 2.5 mg twice daily for recurrent TIAs despite anticoagulation with warfarin. Check MRA of the neck to look for vertebral ostial stenosis. Referred to vestibular rehabilitation for dizziness exercises. Return for followup in 3 months or call earlier if necessary

## 2013-05-16 NOTE — Patient Instructions (Signed)
Discontinue warfarin and change to  Eliquis 2.5 mg twice daily for recurrent TIAs despite anticoagulation with warfarin. Check MRA of the neck to look for vertebral ostial stenosis. Referred to vestibular rehabilitation for dizziness exercises. Return for followup in 3 months or call earlier if necessary

## 2013-05-17 ENCOUNTER — Telehealth: Payer: Self-pay | Admitting: Internal Medicine

## 2013-05-17 DIAGNOSIS — I4891 Unspecified atrial fibrillation: Secondary | ICD-10-CM

## 2013-05-17 DIAGNOSIS — G459 Transient cerebral ischemic attack, unspecified: Secondary | ICD-10-CM

## 2013-05-17 LAB — PROTIME-INR
INR: 2.3 — ABNORMAL HIGH (ref 0.8–1.2)
Prothrombin Time: 24.2 s — ABNORMAL HIGH (ref 9.1–12.0)

## 2013-05-17 NOTE — Telephone Encounter (Signed)
Is calling to let  Dr.Hilty that Dr. Pearlean Brownie has taken Wyatt [name] off of the coumadin and placed him on Eliquis 2.5 mg and he does not need to have the blood checked anymore . And that he has been having some TIA"s and will be having an MRI of the neck and is wanting to know if he should come in to see Dr.Hilty .Marland Kitchen Please call if you have any questions..   Thanks

## 2013-05-17 NOTE — Telephone Encounter (Signed)
Forward to Dr Kapena Minium RN,[facility]

## 2013-05-17 NOTE — Telephone Encounter (Signed)
If pt is having TIAs we should consider giving him full dose of Eliquis (5mg  bid).  His weight is >60kg and Scr is 1.0.  Only compelling reason for lower dose would be age.

## 2013-05-23 MED ORDER — APIXABAN 5 MG PO TABS: 5.0000 mg | ORAL_TABLET | Freq: Two times a day (BID) | ORAL | Status: DC

## 2013-05-23 NOTE — Telephone Encounter (Signed)
Norm Parcel, patient's wife, explaining reason for dosage increase foe Eliquis (per Dr. Noreene Filbert). Wife verbalized understanding. Eliquis 5mg  BID #180 sent to pharmacy.

## 2013-05-23 NOTE — Telephone Encounter (Signed)
Belenda Cruise - I received a noted from Dr. Pearlean Brownie.  I agree with the switch to Eliquis, however, also agree that maximal anticoagulation is recommended as well.  I will CC: Dr. Pearlean Brownie on this note, but I agree with normal serum creatinine and bodyweight - the dose should be 5 mg BID, unless he is aware of another reason to use a lower dose.  -Dr. Rennis Golden

## 2013-05-24 ENCOUNTER — Ambulatory Visit: Payer: Medicare Other | Admitting: Pharmacist Clinician (PhC)/ Clinical Pharmacy Specialist

## 2013-06-01 ENCOUNTER — Ambulatory Visit: Payer: Medicare Other | Attending: Neurology | Admitting: Physical Therapy

## 2013-06-01 DIAGNOSIS — M6281 Muscle weakness (generalized): Secondary | ICD-10-CM | POA: Insufficient documentation

## 2013-06-01 DIAGNOSIS — R262 Difficulty in walking, not elsewhere classified: Secondary | ICD-10-CM | POA: Insufficient documentation

## 2013-06-01 DIAGNOSIS — IMO0001 Reserved for inherently not codable concepts without codable children: Secondary | ICD-10-CM | POA: Insufficient documentation

## 2013-06-06 ENCOUNTER — Ambulatory Visit: Payer: Medicare Other | Admitting: Nurse Practitioner

## 2013-06-06 ENCOUNTER — Ambulatory Visit
Admission: RE | Admit: 2013-06-06 | Discharge: 2013-06-06 | Disposition: A | Discharge status: Home / Self Care | Payer: Medicare Other | Source: Ambulatory Visit | Attending: Neurology | Admitting: Neurology

## 2013-06-06 DIAGNOSIS — I4891 Unspecified atrial fibrillation: Secondary | ICD-10-CM

## 2013-06-06 DIAGNOSIS — G459 Transient cerebral ischemic attack, unspecified: Secondary | ICD-10-CM

## 2013-06-06 MED ORDER — GADOBENATE DIMEGLUMINE 529 MG/ML IV SOLN
15.0000 mL | Freq: Once | INTRAVENOUS | Status: AC | PRN
  Administered 2013-06-06: 15 mL via INTRAVENOUS

## 2013-06-14 ENCOUNTER — Ambulatory Visit: Payer: Medicare Other | Admitting: Physical Therapy

## 2013-06-15 ENCOUNTER — Ambulatory Visit: Payer: Medicare Other | Admitting: Physical Therapy

## 2013-06-17 ENCOUNTER — Ambulatory Visit: Payer: Self-pay | Admitting: Pharmacist Clinician (PhC)/ Clinical Pharmacy Specialist

## 2013-06-17 DIAGNOSIS — Z7901 Long term (current) use of anticoagulants: Secondary | ICD-10-CM

## 2013-06-17 DIAGNOSIS — I480 Paroxysmal atrial fibrillation: Secondary | ICD-10-CM

## 2013-06-21 ENCOUNTER — Telehealth: Payer: Self-pay | Admitting: Neurology

## 2013-06-21 NOTE — Telephone Encounter (Signed)
Called patient and spoke with patient's wife Wyatt Hashimotoatricia concerning his MRA results being normal. Patient's wife stated that the patient was having problems with slurred speech and he drags his right leg a little, every now and then. Patient was seen on 05/16/13, has a F/U on 08/11/13. Patient has also had physical therapy, as directed. Patient's wife would like some advise on how to deal with these issues. Please advise.

## 2013-06-21 NOTE — Telephone Encounter (Signed)
Called patient and spoke with patient's wife Wyatt [name] and informed her that if the patient is experiencing these symptoms (slurred speech) then she needs to take that patient to the ER, per Dr. Pearlean BrownieSethi. Patient stated that this happened this morning and the patient is doing fine right now, but if it happens again that she will take the patient to the ER. I advised the patient wife that if the patient has any other problems, questions or concerns to call the office. Patient's wife verbalized understanding.

## 2013-06-21 NOTE — Telephone Encounter (Signed)
States she has not received the results from his scan. She also states the pt is having problems getting his thoughts out. She states she would like some guidance on how to deal with all that is going on. Please call

## 2013-07-01 ENCOUNTER — Ambulatory Visit: Payer: Medicare Other | Attending: Neurology | Admitting: Physical Therapy

## 2013-07-01 DIAGNOSIS — R262 Difficulty in walking, not elsewhere classified: Secondary | ICD-10-CM | POA: Insufficient documentation

## 2013-07-01 DIAGNOSIS — IMO0001 Reserved for inherently not codable concepts without codable children: Secondary | ICD-10-CM | POA: Insufficient documentation

## 2013-07-01 DIAGNOSIS — M6281 Muscle weakness (generalized): Secondary | ICD-10-CM | POA: Insufficient documentation

## 2013-08-11 ENCOUNTER — Ambulatory Visit (INDEPENDENT_AMBULATORY_CARE_PROVIDER_SITE_OTHER): Payer: Medicare Other | Admitting: Neurology

## 2013-08-11 ENCOUNTER — Encounter: Payer: Self-pay | Admitting: Neurology

## 2013-08-11 VITALS — BP 181/83 | HR 82 | Ht 65.0 in | Wt 166.0 lb

## 2013-08-11 DIAGNOSIS — G3184 Mild cognitive impairment, so stated: Secondary | ICD-10-CM

## 2013-08-11 NOTE — Progress Notes (Signed)
[facility] [address] [place]. [place] [address] [phone]       OFFICE FOLLOW UP  NOTE  Wyatt [name] Date of Birth:  02-12-1929 Medical Record Number:  [id number]   Referring MD: Adela Lank Reason for Referral:  Stroke HPI: Update 08/11/2013 80 : He returns for followup of the last visit 3 months ago. He is accompanied by his son. The patient states he continues to do well without recurrent stroke or TIA symptoms. He still quite physically active but is disappointed that his golf game is nowhere near what it used to be. He has changed from warfarin to Eliquis which he seems to be tolerating well without significant bleeding, bruising or other side effects. He had MI at age of the neck done on 06/06/13 which showed no evidence of vertebral ostial stenosis of any large vessel stenosis. He continues to have mild memory and cognitive difficulties but these appear to be unchanged and not progressive.  Last visit 05/16/2013 : He returns for followup today after loss as a 03/01/13. He seemed today urgently as a revisit upon request from his wife. He has had at least 3 episodes of transient speech disturbance as well as confusion lasting few minutes since her last visit. The patient tries to speak words are not clear he can barely be understood and has more trouble expressing himself. He during one of these episodes the wife noticed that he could not tell the time clearly on his watch and was little confused. He has also been complaining of dizziness which describes as room spinning which occurs only when he bends his head down and the feeling is transient and disappears when he looks up. This has been occurring intermittently but does not occur every time. He denies any ear pain, ringing in the ears or decreased hearing. He has had previous history of transient vertigo on 3-4 occasions over the last 20 years. He has never been evaluated or prescribe any vestibular stabilization  exercises. He has been started on meclizine recently by his primary physician and feels that may be helping him. He states his memory difficulties are unchanged and he has had long-standing minor memory loss for several years which does not appear to be getting any worse. Initial Consult 03/01/13 Wyatt [name] is a 80 year old Caucasian male who had episode of sudden onset of dizziness, gait imbalance and walking like a drunk on 12/22/12 while admitted in the hospital in [place] for workup for a possible syncopal episode. He woke up from sleep and denied any vertigo or headache. He's noticed that he could not walk properly and his balance was off he has trouble coordinating his left side. CT head was unremarkable. He had an MRI scan of the brain done at  [facility] which I personally reviewed showed changes of small vessel disease but no acute infarct. MRA of the brain showed no significant large vessel and stenosis.He was seen by neurologist Dr Caroline More who felt he had a small brainstem or cerebellar infarct not visible on MRI due to small vessel disease. He has gotten his extensive medical records about his hospitalization and test results which I have personally reviewed. Carotid ultrasound showed no significant extraction stenosis. Transthoracic echo showed normal ejection fraction with a cardiac source of embolism. He was on baby aspirin which was continued. Vascular status also identified including hypertension hyperlipidemia and is continued on his medications. He was transferred for inpatient rehabilitation to [facility] in [facility] and was discharged  a month ago. He is currently undergoing physical therapy as an outpatient and has not been steady improvement in and is now able to walk independently without any assistance. He has long standing history of mild short-term memory difficulties which have been unchanged over several years. He had a fall in January when he hit the back of his head without  losing consciousness. Since then he's been having intermittent headaches which he describes as bitemporal sharp shooting and transient. He was seen by a neurologist in [place] for this and underwent temporal artery biopsy which was negative. Headaches still continue but are getting better and last only 30 seconds. He takes Tylenol which seems to help. He has no known prior history of strokes or TIAs. He states his blood pressures are under good control usually in the 120s at home though it is slightly elevated at 159/102 in office today.  ROS:   14 system review of systems is positive dizziness, memory loss, speech difficulty only and all other systems negative  PMH:  Past Medical History  Diagnosis Date  . Hypertension   . Hyperlipemia   . Atrial fibrillation   . Personal history of colonic polyps 04/2009    SERRATED ADENOMA  . Cardiac arrhythmia   . Diverticulosis   . Anxiety   . Cataract   . Lymphocytic colitis   . CAD (coronary artery disease)   . Inferior MI 2001    stent to RCA-Dr. Juanda ChanceBrodie  . CVA (cerebral infarction)     while in [place]  . Syncope and collapse 2014    in [place] prior to the stroke by 24 hours.    Social History:  History   Social History  . Marital Status: Married    Spouse Name: N/A    Number of Children: 5  . Years of Education: college   Occupational History  . Retired    Social History Main Topics  . Smoking status: Never Smoker   . Smokeless tobacco: Never Used  . Alcohol Use: No  . Drug Use: No  . Sexual Activity: Not on file   Other Topics Concern  . Not on file   Social History Narrative  . No narrative on file    Medications:   Current Outpatient Prescriptions on File Prior to Visit  Medication Sig Dispense Refill  . apixaban (ELIQUIS) 5 MG TABS tablet Take 1 tablet (5 mg total) by mouth 2 (two) times daily.  180 tablet  1  . lisinopril (PRINIVIL,ZESTRIL) 10 MG tablet Take 1 tablet (10 mg total) by mouth daily.  90 tablet  3  .  LORazepam (ATIVAN) 0.5 MG tablet Take 0.5 mg by mouth as needed for anxiety.      . metoprolol tartrate (LOPRESSOR) 25 MG tablet Take 1 tablet (25 mg total) by mouth 2 (two) times daily.  180 tablet  3  . Multiple Vitamin (MULTIVITAMIN) tablet Take 1 tablet by mouth daily.        . sodium chloride 1 G tablet Take 1 g by mouth 2 (two) times daily.       No current facility-administered medications on file prior to visit.    Allergies:   Allergies  Allergen Reactions  . Ampicillin     REACTION: unspecified  . Metronidazole     Burning skin and hands    Physical Exam General: well developed, well nourished elderly Caucasian, seated, in no evident distress Head: head normocephalic and atraumatic. Orohparynx benign Neck: supple with no carotid or  supraclavicular bruits Cardiovascular: regular rate and rhythm, no murmurs Musculoskeletal: no deformity. Mild kyphoscoliosis Skin:  no rash/petichiae Vascular:  Normal pulses all extremities Filed Vitals:   08/11/13 1547  BP: 181/83  Pulse: 82    Neurologic Exam Mental Status: Awake and fully alert. Oriented to place and time. Recent and remote memory intact. Attention span, concentration and fund of knowledge appropriate. Mood and affect appropriate. Mini-Mental status exam 27/30 with deficits in recall only. Animal fluency tests 8. Clock drawing 4/4. Geriatric depression scale 5. Cranial Nerves: Fundoscopic exam not done.   Pupils equal, briskly reactive to light. Extraocular movements full without nystagmus. Visual fields full to confrontation. Hearing diminished on the right. Facial sensation intact. Face, tongue, palate moves normally and symmetrically.  Motor: Normal bulk and tone. Normal strength in all tested extremity muscles. Sensory.: intact to touch and pinprick and vibratory sensation.  Coordination: Rapid alternating movements normal in all extremities. Finger-to-nose and heel-to-shin performed accurately bilaterally.   Gait  and Station: Arises from chair without difficulty. Stance is normal. Gait demonstrates normal stride length and balance . Not able to heel, toe and tandem walk without difficulty.  Reflexes: 1+ and symmetric except ankle jerks are depressed. Toes downgoing.   NIHSS  0 Modified Rankin  1  ASSESSMENT: 89 year Caucasian male with episode of sudden onset dizziness and gait ataxia without any other lateralizing signs. thought  to be a small cerebellar infarct not visualized on MRI in July 2014. This occurred 2 days after admission for workup for her syncopal episode in [place]. Negative neurovascular workup and vascular risk factors of hypertension hyperlipidemia coronary artery disease. Mild age-related cognitive impairment which is stable for several years. Recurrent transient sharp bitemporal headaches with negative temporal artery biopsy Paroxysmal atrial fibrillation/flutter-started on Eliquis recently by Dr Rennis Golden but has had recurrent posterior circulation TIAs despite anticoagulation Mild cognitive impairment which is age related and stable    PLAN: Continue Eliquis for secondary stroke prevention. Strict control of hypertension with blood pressure goal below 130/90 I had a long discussion with the patient and son regarding his persistent mild fine motor skills deficits from his stroke and answered questions. Return for followup in 6 months or call earlier if necessary

## 2013-08-11 NOTE — Patient Instructions (Signed)
Continue Eliquis for secondary stroke prevention. Strict control of hypertension with blood pressure goal below 130/90 I had a long discussion with the patient and son regarding his persistent mild fine motor skills deficits from his stroke and answered questions. Return for followup in 6 months or call earlier if necessary

## 2013-09-09 ENCOUNTER — Encounter: Payer: Self-pay | Admitting: Internal Medicine

## 2013-09-09 ENCOUNTER — Ambulatory Visit (INDEPENDENT_AMBULATORY_CARE_PROVIDER_SITE_OTHER): Payer: Medicare Other | Admitting: Internal Medicine

## 2013-09-09 VITALS — BP 142/70 | HR 61 | Ht 68.0 in | Wt 167.8 lb

## 2013-09-09 DIAGNOSIS — I2510 Atherosclerotic heart disease of native coronary artery without angina pectoris: Secondary | ICD-10-CM

## 2013-09-09 DIAGNOSIS — I493 Ventricular premature depolarization: Secondary | ICD-10-CM

## 2013-09-09 DIAGNOSIS — I4891 Unspecified atrial fibrillation: Secondary | ICD-10-CM

## 2013-09-09 DIAGNOSIS — E7849 Other hyperlipidemia: Secondary | ICD-10-CM

## 2013-09-09 DIAGNOSIS — I480 Paroxysmal atrial fibrillation: Secondary | ICD-10-CM

## 2013-09-09 DIAGNOSIS — E782 Mixed hyperlipidemia: Secondary | ICD-10-CM

## 2013-09-09 DIAGNOSIS — I10 Essential (primary) hypertension: Secondary | ICD-10-CM

## 2013-09-09 DIAGNOSIS — I6350 Cerebral infarction due to unspecified occlusion or stenosis of unspecified cerebral artery: Secondary | ICD-10-CM

## 2013-09-09 DIAGNOSIS — I4949 Other premature depolarization: Secondary | ICD-10-CM

## 2013-09-09 DIAGNOSIS — I6381 Other cerebral infarction due to occlusion or stenosis of small artery: Secondary | ICD-10-CM

## 2013-09-09 DIAGNOSIS — I4892 Unspecified atrial flutter: Secondary | ICD-10-CM

## 2013-09-09 NOTE — Patient Instructions (Signed)
Your physician wants you to follow-up in:  6 months. You will receive a reminder letter in the mail two months in advance. If you don't receive a letter, please call our office to schedule the follow-up appointment.   

## 2013-09-09 NOTE — Progress Notes (Signed)
OFFICE NOTE  Chief Complaint:  Routine follow-up  Primary Care Physician: Michiel SitesKOHUT,WALTER DENNIS, MD  HPI:  Wyatt [name] is an 80 year old gentleman from [place] originally with history of hypertension, dyslipidemia, paroxysmal A-fib and an MI with a stent to the right coronary in 2001. Recently had a TIA with some vision loss in the right eye. He has some mild aortic stenosis with peak and mean gradients of 22 and 10 mmHg. His EF has been reserved. He was previously on Pradaxa but refused to take that or warfarin and has been on Plavix and aspirin which he seems to be able to tolerate. However, recently he has discontinued the Plavix and is only taking aspirin once or twice per week. In fact, he is not taking many medications including Lipitor which he stopped which he said was worsening his memory loss. He was also started on Namenda, however, stopped taking that as well. At this point, he seems to be content not taking medications although he is at risk for future strokes and/or worsening heart failure. I counseled him extensively about his risk for stroke and my concern for possible atrial fibrillation. He refused to wear a monitor in the past. Unfortunately, he was recently hospitalized in [place] with a presumptive diagnosis of stroke. He did have a syncopal episode, and imaging studies demonstrated bilateral cerebral small vessel disease. He was noted to be hyponatremic.  He had no cardiac monitoring at that time. He is also felt to have acute gout and was placed on steroids which improved his symptoms. Apparently he was unresponsive at home and had collapsed. There is no evidence for cardiac dysrhythmia noted during the hospital stay however he was hyponatremic with a sodium of 125. He refused Holter monitoring at discharge. Ultimately [place] was [place] which he completed in 3 weeks.  Since that time I spoken with his wife who noted that he was complaining more of  palpitations. Ultimately he was agreeable to wearing a Holter monitor. The monitor demonstrated new onset atrial flutter at a rate of 162. There were also episodes of atrial fibrillation, PVCs and PSVT which was 10 beats on 03/18/2013. He notes that he does continue to have these episodes.  He was previously on a beta blocker, however this was discontinued by him along with other medications. He does appear to be more depressed today he reports that he actually feels less interested in doing the things he typically would do. He used to play golf frequently and notes that now he has no interest and it anymore and feels that his body is "giving up on him".  Wyatt [name] returns today and as actually feeling quite well. He seems to be tolerating Eliquis without any bleeding problems.  He denies any rash but reports some tingling which he gets in his legs and his back. He says it's not exactly itching but feels like needles. I suspect these are paresthesias. His blood pressure is well-controlled and palpitations are no longer present. His EKG shows sinus rhythm today. He and his wife are going to [place] for several months and wanted to get records as well to take with them in case there's a problem.  PMHx:  Past Medical History  Diagnosis Date  . Hypertension   . Hyperlipemia   . Atrial fibrillation   . Personal history of colonic polyps 04/2009    SERRATED ADENOMA  . Cardiac arrhythmia   . Diverticulosis   . Anxiety   .  Cataract   . Lymphocytic colitis   . CAD (coronary artery disease)   . Inferior MI 2001    stent to RCA-Dr. Juanda ChanceBrodie  . CVA (cerebral infarction)     while in [place]  . Syncope and collapse 2014    in [place] prior to the stroke by 24 hours.    Past Surgical History  Procedure Laterality Date  . Coronary angioplasty with stent placement  2001    Dr. Eden EmmsNishan  . Total knee arthroplasty      FAMHx:  Family History  Problem Relation Age of Onset  . Colon cancer Neg Hx   . Liver  cancer Father   . Cancer Sister   . Cancer Sister     SOCHx:   reports that he has never smoked. He has never used smokeless tobacco. He reports that he does not drink alcohol or use illicit drugs.  ALLERGIES:  Allergies  Allergen Reactions  . Ampicillin     REACTION: unspecified  . Metronidazole     Burning skin and hands    ROS: A comprehensive review of systems was negative except for: Constitutional: positive for fatigue and weight loss Cardiovascular: positive for irregular heart beat and palpitations Neurological: positive for stroke  HOME MEDS: Current Outpatient Prescriptions  Medication Sig Dispense Refill  . apixaban (ELIQUIS) 5 MG TABS tablet Take 1 tablet (5 mg total) by mouth 2 (two) times daily.  180 tablet  1  . lisinopril (PRINIVIL,ZESTRIL) 10 MG tablet Take 1 tablet (10 mg total) by mouth daily.  90 tablet  3  . LORazepam (ATIVAN) 0.5 MG tablet Take 0.5 mg by mouth as needed for anxiety.      . metoprolol tartrate (LOPRESSOR) 25 MG tablet Take 1 tablet (25 mg total) by mouth 2 (two) times daily.  180 tablet  3  . Multiple Vitamin (MULTIVITAMIN) tablet Take 1 tablet by mouth daily.        . sodium chloride 1 G tablet Take 1 g by mouth 2 (two) times daily.       No current facility-administered medications for this visit.    LABS/IMAGING: No results found for this or any previous visit (from the past 48 hour(s)). No results found.  VITALS: BP 142/70  Pulse 61  Ht 5\' 8"  (1.727 m)  Wt 167 lb 12.8 oz (76.114 kg)  BMI 25.52 kg/m2  EXAM: General appearance: alert and no distress Neck: no carotid bruit and no JVD Lungs: clear to auscultation bilaterally Heart: regular rate and rhythm, S1, S2 normal and systolic murmur: midsystolic 3/6, crescendo at 2nd right intercostal space Abdomen: soft, non-tender; bowel sounds normal; no masses,  no organomegaly Extremities: extremities normal, atraumatic, no cyanosis or edema Pulses: 2+ and symmetric Skin: Skin  color, texture, turgor normal. No rashes or lesions Neurologic: Grossly normal Psych: Mood, affect normal  EKG: Normal sinus rhythm at 61  ASSESSMENT: 1. Paroxysmal atrial fibrillation/flutter - now in sinus 2. Nonsustained VT and PVCs - resolved 3. Syncope/recent stroke 4. History of TIAs - on eliquis 5. History of medication noncompliance  PLAN: 1.   Wyatt [name] is on Eliquis and seems to be tolerating the medication without any bleeding side effects and or rash. He is reporting some tingling in his back, legs and arms occasionally which I've believe are paresthesias. He occasionally does not take his medication at his scheduled time but reports that he has not missed doses of the Eliquis.  Blood pressure and heart rhythm appears stable today.  Continue his current medications we'll plan to see him back in 6 months.   Chrystie Nose, MD, [facility] Attending Cardiologist [facility]  Chrystie Nose 09/09/2013, 10:11 AM

## 2013-11-30 ENCOUNTER — Other Ambulatory Visit: Payer: Self-pay | Admitting: Internal Medicine

## 2013-12-01 NOTE — Telephone Encounter (Signed)
Rx was sent to pharmacy electronically. 

## 2014-02-09 ENCOUNTER — Encounter: Payer: Self-pay | Admitting: Gastroenterology

## 2014-02-14 ENCOUNTER — Ambulatory Visit: Payer: Medicare Other | Admitting: Neurology

## 2014-03-23 ENCOUNTER — Ambulatory Visit (INDEPENDENT_AMBULATORY_CARE_PROVIDER_SITE_OTHER): Payer: Medicare Other | Admitting: Neurology

## 2014-03-23 ENCOUNTER — Encounter: Payer: Self-pay | Admitting: Neurology

## 2014-03-23 VITALS — BP 147/73 | HR 55 | Ht 68.0 in

## 2014-03-23 DIAGNOSIS — I2510 Atherosclerotic heart disease of native coronary artery without angina pectoris: Secondary | ICD-10-CM

## 2014-03-23 DIAGNOSIS — R413 Other amnesia: Secondary | ICD-10-CM

## 2014-03-23 NOTE — Progress Notes (Signed)
[facility] [address] [place] [id number] [phone]       OFFICE FOLLOW UP  NOTE  [name] Date of Birth:  06/04/1928 Medical Record Number:  [id number]   Referring MD: Adela Lankennis Kohut Reason for Referral:  Stroke HPI: Update 03/23/2014 ; He returns for followup last visit 7 months ago. He continues to do well from a stroke standpoint without definite stroke or TIA symptoms. The patient's wife however feels he may have had a TIA 2 weeks ago but the description is not highly suggestive. Patient stated he felt funny and nauseous and something was going on but was not very descriptive. He laid down for a few minutes and when he got up he felt okay. He did not have any headache speech balance difficulties of weakness. He continues to have intermittent temporal pain which is infrequent. He rarely needs to take that always seems to work well. He continues to have mild short-term memory difficulties which not progressive and unchanged. He is physically quite active and continues to play golf on a daily basis and he feels this has helped his walking. He does get a few minor bruises on his request but has not had any significant bleeding episodes. He states his blood pressure is well controlled. He has no new complaints. Update 3/19/20155 : He returns for followup of the last visit 3 months ago. He is accompanied by his son. The patient states he continues to do well without recurrent stroke or TIA symptoms. He still quite physically active but is disappointed that his golf game is nowhere near what it used to be. He has changed from warfarin to Eliquis which he seems to be tolerating well without significant bleeding, bruising or other side effects. He had MI at age of the neck done on 06/06/13 which showed no evidence of vertebral ostial stenosis of any large vessel stenosis. He continues to have mild memory and cognitive difficulties but these appear to be unchanged and  not progressive.  Last visit 05/16/2013 : He returns for followup today after loss as a 03/01/13. He seemed today urgently as a revisit upon request from his wife. He has had at least 3 episodes of transient speech disturbance as well as confusion lasting few minutes since her last visit. The patient tries to speak words are not clear he can barely be understood and has more trouble expressing himself. He during one of these episodes the wife noticed that he could not tell the time clearly on his watch and was little confused. He has also been complaining of dizziness which describes as room spinning which occurs only when he bends his head down and the feeling is transient and disappears when he looks up. This has been occurring intermittently but does not occur every time. He denies any ear pain, ringing in the ears or decreased hearing. He has had previous history of transient vertigo on 3-4 occasions over the last 20 years. He has never been evaluated or prescribe any vestibular stabilization exercises. He has been started on meclizine recently by his primary physician and feels that may be helping him. He states his memory difficulties are unchanged and he has had long-standing minor memory loss for several years which does not appear to be getting any worse. Initial Consult 03/01/13 Mr Wyatt [name] is a 80 year old Caucasian male who had episode of sudden onset of dizziness, gait imbalance and walking like a drunk on 12/22/12 while admitted in the hospital in [place]  for workup for a possible syncopal episode. He woke up from sleep and denied any vertigo or headache. He's noticed that he could not walk properly and his balance was off he has trouble coordinating his left side. CT head was unremarkable. He had an MRI scan of the brain done at  [facility] which I personally reviewed showed changes of small vessel disease but no acute infarct. MRA of the brain showed no significant large vessel and  stenosis.He was seen by neurologist Dr Caroline More who felt he had a small brainstem or cerebellar infarct not visible on MRI due to small vessel disease. He has gotten his extensive medical records about his hospitalization and test results which I have personally reviewed. Carotid ultrasound showed no significant extraction stenosis. Transthoracic echo showed normal ejection fraction with a cardiac source of embolism. He was on baby aspirin which was continued. Vascular status also identified including hypertension hyperlipidemia and is continued on his medications. He was transferred for inpatient rehabilitation to [facility] in [facility] and was discharged a month ago. He is currently undergoing physical therapy as an outpatient and has not been steady improvement in and is now able to walk independently without any assistance. He has long standing history of mild short-term memory difficulties which have been unchanged over several years. He had a fall in January when he hit the back of his head without losing consciousness. Since then he's been having intermittent headaches which he describes as bitemporal sharp shooting and transient. He was seen by a neurologist in [place] for this and underwent temporal artery biopsy which was negative. Headaches still continue but are getting better and last only 30 seconds. He takes Tylenol which seems to help. He has no known prior history of strokes or TIAs. He states his blood pressures are under good control usually in the 120s at home though it is slightly elevated at 159/102 in office today.  ROS:   14 system review of systems is positive headache, memory loss, speech difficulty only and all other systems negative  PMH:  Past Medical History  Diagnosis Date  . Hypertension   . Hyperlipemia   . Atrial fibrillation   . Personal history of colonic polyps 04/2009    SERRATED ADENOMA  . Cardiac arrhythmia   . Diverticulosis   . Anxiety   . Cataract   .  Lymphocytic colitis   . CAD (coronary artery disease)   . Inferior MI 2001    stent to RCA-Dr. Juanda Chance  . CVA (cerebral infarction)     while in [place]  . Syncope and collapse 2014    in [place] prior to the stroke by 24 hours.    Social History:  History   Social History  . Marital Status: Married    Spouse Name: N/A    Number of Children: 5  . Years of Education: college   Occupational History  . Retired    Social History Main Topics  . Smoking status: Never Smoker   . Smokeless tobacco: Never Used  . Alcohol Use: No  . Drug Use: No  . Sexual Activity: Not on file   Other Topics Concern  . Not on file   Social History Narrative   Patient is married with 5 children.   Patient is right handed.   Patient has college education.   Patient drinks 3 cups daily.    Medications:   Current Outpatient Prescriptions on File Prior to Visit  Medication Sig Dispense Refill  .  ELIQUIS 5 MG TABS tablet TAKE 1 TABLET BY MOUTH TWICE DAILY  180 tablet  2  . lisinopril (PRINIVIL,ZESTRIL) 10 MG tablet Take 1 tablet (10 mg total) by mouth daily.  90 tablet  3  . LORazepam (ATIVAN) 0.5 MG tablet Take 0.5 mg by mouth as needed for anxiety.      . metoprolol tartrate (LOPRESSOR) 25 MG tablet Take 1 tablet (25 mg total) by mouth 2 (two) times daily.  180 tablet  3  . Multiple Vitamin (MULTIVITAMIN) tablet Take 1 tablet by mouth daily.        . sodium chloride 1 G tablet Take 1 g by mouth 2 (two) times daily.       No current facility-administered medications on file prior to visit.    Allergies:   Allergies  Allergen Reactions  . Ampicillin     REACTION: unspecified  . Metronidazole     Burning skin and hands    Physical Exam General: well developed, well nourished elderly Caucasian, seated, in no evident distress Head: head normocephalic and atraumatic. Orohparynx benign Neck: supple with no carotid or supraclavicular bruits Cardiovascular: regular rate and rhythm, no  murmurs Musculoskeletal: no deformity. Mild kyphoscoliosis Skin:  no rash/petichiae Vascular:  Normal pulses all extremities Filed Vitals:   03/23/14 1040  BP: 147/73  Pulse: 55    Neurologic Exam Mental Status: Awake and fully alert. Oriented to place and time. Recent and remote memory intact. Attention span, concentration and fund of knowledge appropriate. Mood and affect appropriate. Mini-Mental status exam 25/30 with deficits in orientation and  recall only. Animal fluency test 18. Clock drawing 3/4. Geriatric depression scale 6. Cranial Nerves: Fundoscopic exam not done.   Pupils equal, briskly reactive to light. Extraocular movements full without nystagmus. Visual fields full to confrontation. Hearing diminished on the right. Facial sensation intact. Face, tongue, palate moves normally and symmetrically.  Motor: Normal bulk and tone. Normal strength in all tested extremity muscles. Sensory.: intact to touch and pinprick and vibratory sensation.  Coordination: Rapid alternating movements normal in all extremities. Finger-to-nose and heel-to-shin performed accurately bilaterally.   Gait and Station: Arises from chair without difficulty. Stance is normal. Gait demonstrates normal stride length and balance . Not able to heel, toe and tandem walk without difficulty.  Reflexes: 1+ and symmetric except ankle jerks are depressed. Toes downgoing.   NIHSS  0 Modified Rankin  1  ASSESSMENT: [age] year Caucasian male with episode of sudden onset dizziness and gait ataxia without any other lateralizing signs. thought  to be a small cerebellar infarct not visualized on MRI in July 2014. This occurred 2 days after admission for workup for her syncopal episode in [place]. Negative neurovascular workup and vascular risk factors of hypertension hyperlipidemia coronary artery disease. Mild age-related cognitive impairment which is stable for several years. Recurrent transient sharp bitemporal headaches with  negative temporal artery biopsy Paroxysmal atrial fibrillation/flutter-started on Eliquis recently by Dr Rennis GoldenHilty but has had recurrent posterior circulation TIAs despite anticoagulation Mild cognitive impairment which is age related and stable    PLAN:  I had a long discussion with the patient and his wife regarding his prior stroke and secondary stroke prevention strategies and answered questions. Continue eliquis for stroke prevention and strict control of hypertension with blood pressure goal below 130/90. I encouraged patient to participate and mentally challenging activities for his mild cognitive impairment which appear stable. Return for followup in 6 months or call earlier if necessary

## 2014-03-23 NOTE — Patient Instructions (Addendum)
I had a long discussion with the patient and his wife regarding his prior stroke and secondary stroke prevention strategies and answered questions. Continue eliquis for stroke prevention and strict control of hypertension with blood pressure goal below 130/90. I encouraged patient to participate and mentally challenging activities for his mild cognitive impairment which appear stable. Return for followup in 6 months or call earlier if necessary

## 2014-03-24 ENCOUNTER — Other Ambulatory Visit: Payer: Self-pay | Admitting: Internal Medicine

## 2014-03-24 NOTE — Telephone Encounter (Signed)
Rx was sent to pharmacy electronically. OV 11/17 

## 2014-04-11 ENCOUNTER — Encounter: Payer: Self-pay | Admitting: Internal Medicine

## 2014-04-11 ENCOUNTER — Ambulatory Visit (INDEPENDENT_AMBULATORY_CARE_PROVIDER_SITE_OTHER): Payer: Medicare Other | Admitting: Internal Medicine

## 2014-04-11 VITALS — BP 132/62 | HR 57 | Ht 68.5 in | Wt 166.8 lb

## 2014-04-11 DIAGNOSIS — I2583 Coronary atherosclerosis due to lipid rich plaque: Secondary | ICD-10-CM

## 2014-04-11 DIAGNOSIS — R55 Syncope and collapse: Secondary | ICD-10-CM

## 2014-04-11 DIAGNOSIS — Z8673 Personal history of transient ischemic attack (TIA), and cerebral infarction without residual deficits: Secondary | ICD-10-CM

## 2014-04-11 DIAGNOSIS — I4892 Unspecified atrial flutter: Secondary | ICD-10-CM

## 2014-04-11 DIAGNOSIS — Z7901 Long term (current) use of anticoagulants: Secondary | ICD-10-CM

## 2014-04-11 DIAGNOSIS — R002 Palpitations: Secondary | ICD-10-CM

## 2014-04-11 DIAGNOSIS — I2510 Atherosclerotic heart disease of native coronary artery without angina pectoris: Secondary | ICD-10-CM

## 2014-04-11 NOTE — Progress Notes (Signed)
OFFICE NOTE  Chief Complaint:  Routine follow-up  Primary Care Physician: Michiel SitesKOHUT,WALTER DENNIS, MD  HPI:  Wyatt [name] is an 80 year old gentleman from [place] originally with history of hypertension, dyslipidemia, paroxysmal A-fib and an MI with a stent to the right coronary in 2001. Recently had a TIA with some vision loss in the right eye. He has some mild aortic stenosis with peak and mean gradients of 22 and 10 mmHg. His EF has been reserved. He was previously on Pradaxa but refused to take that or warfarin and has been on Plavix and aspirin which he seems to be able to tolerate. However, recently he has discontinued the Plavix and is only taking aspirin once or twice per week. In fact, he is not taking many medications including Lipitor which he stopped which he said was worsening his memory loss. He was also started on Namenda, however, stopped taking that as well. At this point, he seems to be content not taking medications although he is at risk for future strokes and/or worsening heart failure. I counseled him extensively about his risk for stroke and my concern for possible atrial fibrillation. He refused to wear a monitor in the past. Unfortunately, he was recently hospitalized in [place] with a presumptive diagnosis of stroke. He did have a syncopal episode, and imaging studies demonstrated bilateral cerebral small vessel disease. He was noted to be hyponatremic.  He had no cardiac monitoring at that time. He is also felt to have acute gout and was placed on steroids which improved his symptoms. Apparently he was unresponsive at home and had collapsed. There is no evidence for cardiac dysrhythmia noted during the hospital stay however he was hyponatremic with a sodium of 125. He refused Holter monitoring at discharge. Ultimately [place] was [place] which he completed in 3 weeks.  Since that time I spoken with his wife who noted that he was complaining more of  palpitations. Ultimately he was agreeable to wearing a Holter monitor. The monitor demonstrated new onset atrial flutter at a rate of 162. There were also episodes of atrial fibrillation, PVCs and PSVT which was 10 beats on 03/18/2013. He notes that he does continue to have these episodes.  He was previously on a beta blocker, however this was discontinued by him along with other medications. He does appear to be more depressed today he reports that he actually feels less interested in doing the things he typically would do. He used to play golf frequently and notes that now he has no interest and it anymore and feels that his body is "giving up on him".  Wyatt [name] returns today and as actually feeling quite well. He seems to be tolerating Eliquis without any bleeding problems. He feels that his deficits are almost completely resolved. His wife and he just got back from several months up in [place]. He reports that he is now starting to hit golf balls again. He continues to have problems with short-term memory loss but is not had recurrent atrial fibrillation, chest pain or worsening shortness of breath. He is now on salt tablets for presumed orthostatic symptoms in addition to his blood pressure medications.  PMHx:  Past Medical History  Diagnosis Date  . Hypertension   . Hyperlipemia   . Atrial fibrillation   . Personal history of colonic polyps 04/2009    SERRATED ADENOMA  . Cardiac arrhythmia   . Diverticulosis   . Anxiety   . Cataract   .  Lymphocytic colitis   . CAD (coronary artery disease)   . Inferior MI 2001    stent to RCA-Dr. Juanda ChanceBrodie  . CVA (cerebral infarction)     while in [place]  . Syncope and collapse 2014    in [place] prior to the stroke by 24 hours.    Past Surgical History  Procedure Laterality Date  . Coronary angioplasty with stent placement  2001    Dr. Eden EmmsNishan  . Total knee arthroplasty      FAMHx:  Family History  Problem Relation Age of Onset  . Colon cancer  Neg Hx   . Liver cancer Father   . Cancer Sister   . Cancer Sister     SOCHx:   reports that he has never smoked. He has never used smokeless tobacco. He reports that he does not drink alcohol or use illicit drugs.  ALLERGIES:  Allergies  Allergen Reactions  . Ampicillin     REACTION: unspecified  . Metronidazole     Burning skin and hands    ROS: A comprehensive review of systems was negative.  HOME MEDS: Current Outpatient Prescriptions  Medication Sig Dispense Refill  . ATORVASTATIN CALCIUM PO Take by mouth daily.    Marland Kitchen. ELIQUIS 5 MG TABS tablet TAKE 1 TABLET BY MOUTH TWICE DAILY 180 tablet 2  . lisinopril (PRINIVIL,ZESTRIL) 10 MG tablet Take 1 tablet (10 mg total) by mouth daily. 90 tablet 3  . LORazepam (ATIVAN) 0.5 MG tablet Take 0.5 mg by mouth as needed for anxiety.    . metoprolol tartrate (LOPRESSOR) 25 MG tablet TAKE 1 TABLET BY MOUTH TWICE DAILY 180 tablet 0  . Multiple Vitamin (MULTIVITAMIN) tablet Take 1 tablet by mouth daily.      . sodium chloride 1 G tablet Take 1 g by mouth 2 (two) times daily.     No current facility-administered medications for this visit.    LABS/IMAGING: No results found for this or any previous visit (from the past 48 hour(s)). No results found.  VITALS: BP 132/62 mmHg  Pulse 57  Ht 5' 8.5" (1.74 m)  Wt 166 lb 12.8 oz (75.66 kg)  BMI 24.99 kg/m2  EXAM: General appearance: alert and no distress Neck: no carotid bruit and no JVD Lungs: clear to auscultation bilaterally Heart: regular rate and rhythm, S1, S2 normal and systolic murmur: midsystolic 3/6, crescendo at 2nd right intercostal space Abdomen: soft, non-tender; bowel sounds normal; no masses,  no organomegaly Extremities: extremities normal, atraumatic, no cyanosis or edema Pulses: 2+ and symmetric Skin: Skin color, texture, turgor normal. No rashes or lesions Neurologic: Grossly normal Psych: Mood, affect normal  EKG: Sinus bradycardia  57  ASSESSMENT: 1. Paroxysmal atrial fibrillation/flutter - now in sinus 2. Nonsustained VT and PVCs - resolved 3. Syncope/recent stroke - started on salt tablets per neurology 4. History of TIAs - on eliquis 5. History of medication noncompliance - improved 6. Dyslipidemia-on statin  PLAN: 1.   Wyatt [name] has not had recurrence of A. Fib to my knowledge. His issues with orthostatic syncope have improved, now on salt tablets. He does have hypertension and coronary disease on medications for that. He is taking Eliquis, without any bleeding complications. He is recovering from his stroke and able to do more activities. I would recommend continue his current medications and we'll see him back in 6 months.  Chrystie NoseKenneth C. Hilty, MD, [facility] Attending Cardiologist [facility]  HILTY,Kenneth C 04/11/2014, 1:36 PM

## 2014-04-11 NOTE — Patient Instructions (Signed)
Your physician wants you to follow-up in: 6 months with Dr. Hilty. You will receive a reminder letter in the mail two months in advance. If you don't receive a letter, please call our office to schedule the follow-up appointment.    

## 2014-05-01 ENCOUNTER — Other Ambulatory Visit: Payer: Self-pay | Admitting: Internal Medicine

## 2014-05-02 NOTE — Telephone Encounter (Signed)
Rx was sent to pharmacy electronically. 

## 2014-06-26 ENCOUNTER — Other Ambulatory Visit: Payer: Self-pay | Admitting: Internal Medicine

## 2014-06-26 NOTE — Telephone Encounter (Signed)
Rx refill sent to patient pharmacy   

## 2014-08-07 ENCOUNTER — Telehealth: Payer: Self-pay | Admitting: Internal Medicine

## 2014-08-07 DIAGNOSIS — E789 Disorder of lipoprotein metabolism, unspecified: Secondary | ICD-10-CM | POA: Diagnosis not present

## 2014-08-07 DIAGNOSIS — R05 Cough: Secondary | ICD-10-CM | POA: Diagnosis not present

## 2014-08-07 DIAGNOSIS — I10 Essential (primary) hypertension: Secondary | ICD-10-CM | POA: Diagnosis not present

## 2014-08-07 NOTE — Telephone Encounter (Signed)
Spoke with pt wife, patient is c/o just not feeling well. It started yesterday. His bp is 140/71 with pulse 71. Denies cp or sob. They are going to call his primary to be seen.

## 2014-08-07 NOTE — Telephone Encounter (Signed)
Wyatt [name] is calling because [name] is stating that he is feeling weak and looks extremely pale . Please call    Thanks

## 2014-08-28 ENCOUNTER — Telehealth: Payer: Self-pay | Admitting: Internal Medicine

## 2014-08-28 NOTE — Telephone Encounter (Signed)
Rx refill sent to patient pharmacy. Patient notified  

## 2014-08-28 NOTE — Telephone Encounter (Signed)
°  1. Which medications need to be refilled?Eliquis-Pt needs this today please  2. Which pharmacy is medication to be sent to?[phone]  3. Do they need a 30 day or 90 day supply? #180 and refills  4. Would they like a call back once the medication has been sent to the pharmacy? yes

## 2014-09-06 ENCOUNTER — Ambulatory Visit (INDEPENDENT_AMBULATORY_CARE_PROVIDER_SITE_OTHER): Payer: Medicare Other | Admitting: Internal Medicine

## 2014-09-06 ENCOUNTER — Encounter: Payer: Self-pay | Admitting: Internal Medicine

## 2014-09-06 VITALS — BP 126/60 | HR 58 | Ht 68.0 in | Wt 169.1 lb

## 2014-09-06 DIAGNOSIS — I480 Paroxysmal atrial fibrillation: Secondary | ICD-10-CM | POA: Diagnosis not present

## 2014-09-06 DIAGNOSIS — Z7901 Long term (current) use of anticoagulants: Secondary | ICD-10-CM

## 2014-09-06 DIAGNOSIS — I2510 Atherosclerotic heart disease of native coronary artery without angina pectoris: Secondary | ICD-10-CM

## 2014-09-06 DIAGNOSIS — I2583 Coronary atherosclerosis due to lipid rich plaque: Secondary | ICD-10-CM

## 2014-09-06 DIAGNOSIS — Z8673 Personal history of transient ischemic attack (TIA), and cerebral infarction without residual deficits: Secondary | ICD-10-CM

## 2014-09-06 DIAGNOSIS — G3184 Mild cognitive impairment, so stated: Secondary | ICD-10-CM

## 2014-09-06 NOTE — Patient Instructions (Addendum)
Dr Hilty wants you to follow-up in 1 year. You will receive a reminder letter in the mail two months in advance. If you don't receive a letter, please call our office to schedule the follow-up appointment. 

## 2014-09-06 NOTE — Progress Notes (Signed)
OFFICE NOTE  Chief Complaint:  Routine follow-up  Primary Care Physician: Michiel Sites, MD  HPI:  Wyatt [name] is an 81 year old gentleman from [place] originally with history of hypertension, dyslipidemia, paroxysmal A-fib and an MI with a stent to the right coronary in 2001. Recently had a TIA with some vision loss in the right eye. He has some mild aortic stenosis with peak and mean gradients of 22 and 10 mmHg. His EF has been reserved. He was previously on Pradaxa but refused to take that or warfarin and has been on Plavix and aspirin which he seems to be able to tolerate. However, recently he has discontinued the Plavix and is only taking aspirin once or twice per week. In fact, he is not taking many medications including Lipitor which he stopped which he said was worsening his memory loss. He was also started on Namenda, however, stopped taking that as well. At this point, he seems to be content not taking medications although he is at risk for future strokes and/or worsening heart failure. I counseled him extensively about his risk for stroke and my concern for possible atrial fibrillation. He refused to wear a monitor in the past. Unfortunately, he was recently hospitalized in [place] with a presumptive diagnosis of stroke. He did have a syncopal episode, and imaging studies demonstrated bilateral cerebral small vessel disease. He was noted to be hyponatremic.  He had no cardiac monitoring at that time. He is also felt to have acute gout and was placed on steroids which improved his symptoms. Apparently he was unresponsive at home and had collapsed. There is no evidence for cardiac dysrhythmia noted during the hospital stay however he was hyponatremic with a sodium of 125. He refused Holter monitoring at discharge. Ultimately he was sent to rehabilitation in [place] which he completed in 3 weeks.  Since that time I spoken with his wife who noted that he was complaining more of  palpitations. Ultimately he was agreeable to wearing a Holter monitor. The monitor demonstrated new onset atrial flutter at a rate of 162. There were also episodes of atrial fibrillation, PVCs and PSVT which was 10 beats on 03/18/2013. He notes that he does continue to have these episodes.  He was previously on a beta blocker, however this was discontinued by him along with other medications. He does appear to be more depressed today he reports that he actually feels less interested in doing the things he typically would do. He used to play golf frequently and notes that now he has no interest and it anymore and feels that his body is "giving up on him".  Wyatt [name] returns today and as actually feeling quite well. He seems to be tolerating Eliquis without any bleeding problems. He feels that his deficits are almost completely resolved. His wife and he just got back from several months up in [place]. He reports that he is now starting to hit golf balls again. He continues to have problems with short-term memory loss but is not had recurrent atrial fibrillation, chest pain or worsening shortness of breath. He is now on salt tablets for presumed orthostatic symptoms in addition to his blood pressure medications.  I saw Wyatt [name] back in the office today. Overall he is doing well denies any chest pain or worsening shortness of breath. He's had no bleeding problems on Eliquis. He's had no further stroke or TIA that were aware of. Heart rate and blood pressure at goal.  PMHx:  Past Medical History  Diagnosis Date  . Hypertension   . Hyperlipemia   . Atrial fibrillation   . Personal history of colonic polyps 04/2009    SERRATED ADENOMA  . Cardiac arrhythmia   . Diverticulosis   . Anxiety   . Cataract   . Lymphocytic colitis   . CAD (coronary artery disease)   . Inferior MI 2001    stent to RCA-Dr. Juanda ChanceBrodie  . CVA (cerebral infarction)     while in [place]  . Syncope and collapse 2014    in [place]  prior to the stroke by 24 hours.    Past Surgical History  Procedure Laterality Date  . Coronary angioplasty with stent placement  2001    Dr. Eden EmmsNishan  . Total knee arthroplasty      FAMHx:  Family History  Problem Relation Age of Onset  . Colon cancer Neg Hx   . Liver cancer Father   . Cancer Sister   . Cancer Sister     SOCHx:   reports that he has never smoked. He has never used smokeless tobacco. He reports that he does not drink alcohol or use illicit drugs.  ALLERGIES:  Allergies  Allergen Reactions  . Ampicillin     REACTION: unspecified  . Metronidazole     Burning skin and hands    ROS: A comprehensive review of systems was negative.  HOME MEDS: Current Outpatient Prescriptions  Medication Sig Dispense Refill  . ATORVASTATIN CALCIUM PO Take by mouth daily.    Marland Kitchen. ELIQUIS 5 MG TABS tablet TAKE 1 TABLET BY MOUTH TWICE DAILY TAKE 1 TABLET BY MOUTH TWICE DAILY 180 tablet 0  . lisinopril (PRINIVIL,ZESTRIL) 10 MG tablet TAKE 1 TABLET BY MOUTH DAILY 90 tablet 3  . LORazepam (ATIVAN) 0.5 MG tablet Take 0.5 mg by mouth as needed for anxiety.    . metoprolol tartrate (LOPRESSOR) 25 MG tablet TAKE 1 TABLET BY MOUTH TWICE DAILY 180 tablet 1  . Multiple Vitamin (MULTIVITAMIN) tablet Take 1 tablet by mouth daily.      . sodium chloride 1 G tablet Take 1 g by mouth 2 (two) times daily.     No current facility-administered medications for this visit.    LABS/IMAGING: No results found for this or any previous visit (from the past 48 hour(s)). No results found.  VITALS: BP 126/60 mmHg  Pulse 58  Ht 5\' 8"  (1.727 m)  Wt 169 lb 1.6 oz (76.703 kg)  BMI 25.72 kg/m2  EXAM: General appearance: alert and no distress Neck: no carotid bruit and no JVD Lungs: clear to auscultation bilaterally Heart: regular rate and rhythm, S1, S2 normal and systolic murmur: midsystolic 3/6, crescendo at 2nd right intercostal space Abdomen: soft, non-tender; bowel sounds normal; no masses,   no organomegaly Extremities: extremities normal, atraumatic, no cyanosis or edema Pulses: 2+ and symmetric Skin: Skin color, texture, turgor normal. No rashes or lesions Neurologic: Grossly normal Psych: Mood, affect normal  EKG: Sinus bradycardia 58  ASSESSMENT: 1. Paroxysmal atrial fibrillation/flutter - now in sinus 2. Nonsustained VT and PVCs - resolved 3. Syncope/stroke - started on salt tablets per neurology 4. History of TIAs - on eliquis 5. History of medication noncompliance - improved 6. Dyslipidemia-on statin  PLAN: 1.   Wyatt [name] has not had recurrence of A. Fib to my knowledge. His issues with orthostatic syncope have improved, now on salt tablets. He does have hypertension and coronary disease on medications for that. He is taking Eliquis, without any  bleeding complications. He is recovering from his stroke and able to do more activities. I would recommend continue his current medications and we'll see him back in 1 year. Chrystie Nose, MD, [facility] Attending Cardiologist [facility]  Kailin Principato C 09/06/2014, 1:22 PM

## 2014-09-22 ENCOUNTER — Encounter: Payer: Self-pay | Admitting: Neurology

## 2014-09-22 ENCOUNTER — Ambulatory Visit (INDEPENDENT_AMBULATORY_CARE_PROVIDER_SITE_OTHER): Payer: Medicare Other | Admitting: Neurology

## 2014-09-22 VITALS — BP 163/74 | HR 55 | Wt 166.0 lb

## 2014-09-22 DIAGNOSIS — I2510 Atherosclerotic heart disease of native coronary artery without angina pectoris: Secondary | ICD-10-CM | POA: Diagnosis not present

## 2014-09-22 DIAGNOSIS — G3184 Mild cognitive impairment, so stated: Secondary | ICD-10-CM

## 2014-09-22 MED ORDER — CEREFOLIN 6-1-50-5 MG PO TABS: 1.0000 | ORAL_TABLET | ORAL | Status: DC

## 2014-09-22 NOTE — Progress Notes (Signed)
[facility] [address] [place]. [place] [id number] [phone]       OFFICE FOLLOW UP  NOTE  [name] A Hover Date of Birth:  06/04/1928 Medical Record Number:  [id number]   Referring MD: Adela Lankennis Kohut Reason for Referral:  Stroke HPI: Update 03/23/2014 ; He returns for followup last visit 7 months ago. He continues to do well from a stroke standpoint without definite stroke or TIA symptoms. The patient's wife however feels he may have had a TIA 2 weeks ago but the description is not highly suggestive. Patient stated he felt funny and nauseous and something was going on but was not very descriptive. He laid down for a few minutes and when he got up he felt okay. He did not have any headache speech balance difficulties of weakness. He continues to have intermittent temporal pain which is infrequent. He rarely needs to take that always seems to work well. He continues to have mild short-term memory difficulties which not progressive and unchanged. He is physically quite active and continues to play golf on a daily basis and he feels this has helped his walking. He does get a few minor bruises on his request but has not had any significant bleeding episodes. He states his blood pressure is well controlled. He has no new complaints. Update 3/19/20155 : He returns for followup of the last visit 3 months ago. He is accompanied by his son. The patient states he continues to do well without recurrent stroke or TIA symptoms. He still quite physically active but is disappointed that his golf game is nowhere near what it used to be. He has changed from warfarin to Eliquis which he seems to be tolerating well without significant bleeding, bruising or other side effects. He had MI at age of the neck done on 06/06/13 which showed no evidence of vertebral ostial stenosis of any large vessel stenosis. He continues to have mild memory and cognitive difficulties but these appear to be unchanged and  not progressive.  Last visit 05/16/2013 : He returns for followup today after loss as a 03/01/13. He seemed today urgently as a revisit upon request from his wife. He has had at least 3 episodes of transient speech disturbance as well as confusion lasting few minutes since her last visit. The patient tries to speak words are not clear he can barely be understood and has more trouble expressing himself. He during one of these episodes the wife noticed that he could not tell the time clearly on his watch and was little confused. He has also been complaining of dizziness which describes as room spinning which occurs only when he bends his head down and the feeling is transient and disappears when he looks up. This has been occurring intermittently but does not occur every time. He denies any ear pain, ringing in the ears or decreased hearing. He has had previous history of transient vertigo on 3-4 occasions over the last 20 years. He has never been evaluated or prescribe any vestibular stabilization exercises. He has been started on meclizine recently by his primary physician and feels that may be helping him. He states his memory difficulties are unchanged and he has had long-standing minor memory loss for several years which does not appear to be getting any worse. Initial Consult 03/01/13 Mr Wyatt [name] is a 81 year old Caucasian male who had episode of sudden onset of dizziness, gait imbalance and walking like a drunk on 12/22/12 while admitted in the hospital in [place]  for workup for a possible syncopal episode. He woke up from sleep and denied any vertigo or headache. He's noticed that he could not walk properly and his balance was off he has trouble coordinating his left side. CT head was unremarkable. He had an MRI scan of the brain done at  [facility] which I personally reviewed showed changes of small vessel disease but no acute infarct. MRA of the brain showed no significant large vessel and  stenosis.He was seen by neurologist Dr Caroline More who felt he had a small brainstem or cerebellar infarct not visible on MRI due to small vessel disease. He has gotten his extensive medical records about his hospitalization and test results which I have personally reviewed. Carotid ultrasound showed no significant extraction stenosis. Transthoracic echo showed normal ejection fraction with a cardiac source of embolism. He was on baby aspirin which was continued. Vascular status also identified including hypertension hyperlipidemia and is continued on his medications. He was transferred for inpatient rehabilitation to [facility] in [facility] and was discharged a month ago. He is currently undergoing physical therapy as an outpatient and has not been steady improvement in and is now able to walk independently without any assistance. He has long standing history of mild short-term memory difficulties which have been unchanged over several years. He had a fall in January when he hit the back of his head without losing consciousness. Since then he's been having intermittent headaches which he describes as bitemporal sharp shooting and transient. He was seen by a neurologist in [place] for this and underwent temporal artery biopsy which was negative. Headaches still continue but are getting better and last only 30 seconds. He takes Tylenol which seems to help. He has no known prior history of strokes or TIAs. He states his blood pressures are under good control usually in the 120s at home though it is slightly elevated at 159/102 in office today. Update 09/22/2014 : He returns for follow-up after last visit 6 months ago. Is accompanied by his son eater. Patient and son both feel that they've noticed some mild progression of her short-term memory difficulties. He still is quite independent in activities of daily living. He lives at home in fact looks after his wife was and neck and shoulder problems. He has been persistently  working on his golf game and is proud to state that he is making good progress. He is still quite active in the yard and working at home. He is tolerating eliquis with minor bruising but no major bleeding episodes. His blood pressure control has been good. He denies any recurrent stroke or TIA symptoms. ROS:   14 system review of systems is positive snoring memory loss, speech difficulty , bladder urgency, minor vision loss only and all other systems negative  PMH:  Past Medical History  Diagnosis Date  . Hypertension   . Hyperlipemia   . Atrial fibrillation   . Personal history of colonic polyps 04/2009    SERRATED ADENOMA  . Cardiac arrhythmia   . Diverticulosis   . Anxiety   . Cataract   . Lymphocytic colitis   . CAD (coronary artery disease)   . Inferior MI 2001    stent to RCA-Dr. Juanda Chance  . CVA (cerebral infarction)     while in [place]  . Syncope and collapse 2014    in [place] prior to the stroke by 24 hours.    Social History:  History   Social History  . Marital Status: Married  Spouse Name: N/A  . Number of Children: 5  . Years of Education: college   Occupational History  . Retired    Social History Main Topics  . Smoking status: Never Smoker   . Smokeless tobacco: Never Used  . Alcohol Use: No  . Drug Use: No  . Sexual Activity: Not on file   Other Topics Concern  . Not on file   Social History Narrative   Patient is married with 5 children.   Patient is right handed.   Patient has college education.   Patient drinks 3 cups daily.    Medications:   Current Outpatient Prescriptions on File Prior to Visit  Medication Sig Dispense Refill  . ATORVASTATIN CALCIUM PO Take by mouth daily.    Marland Kitchen. ELIQUIS 5 MG TABS tablet TAKE 1 TABLET BY MOUTH TWICE DAILY TAKE 1 TABLET BY MOUTH TWICE DAILY 180 tablet 0  . lisinopril (PRINIVIL,ZESTRIL) 10 MG tablet TAKE 1 TABLET BY MOUTH DAILY 90 tablet 3  . LORazepam (ATIVAN) 0.5 MG tablet Take 0.5 mg by mouth as needed for  anxiety.    . metoprolol tartrate (LOPRESSOR) 25 MG tablet TAKE 1 TABLET BY MOUTH TWICE DAILY 180 tablet 1  . Multiple Vitamin (MULTIVITAMIN) tablet Take 1 tablet by mouth daily.      . sodium chloride 1 G tablet Take 1 g by mouth 2 (two) times daily.     No current facility-administered medications on file prior to visit.    Allergies:   Allergies  Allergen Reactions  . Ampicillin     REACTION: unspecified  . Metronidazole     Burning skin and hands    Physical Exam General: well developed, well nourished elderly Caucasian, seated, in no evident distress Head: head normocephalic and atraumatic. Orohparynx benign Neck: supple with no carotid or supraclavicular bruits Cardiovascular: regular rate and rhythm, no murmurs Musculoskeletal: no deformity. Mild kyphoscoliosis Skin:  no rash/petichiae Vascular:  Normal pulses all extremities Filed Vitals:   09/22/14 1015  BP: 163/74  Pulse: 55    Neurologic Exam Mental Status: Awake and fully alert. Oriented to place and time. Recent and remote memory intact. Attention span, concentration and fund of knowledge appropriate. Mood and affect appropriate. Mini-Mental status exam 20/30 ( last visit 25/30 ) with deficits in orientation, attention and  recall only. Animal fluency test 15. Clock drawing 3/4. Geriatric depression scale 1. Cranial Nerves: Fundoscopic exam not done.   Pupils equal, briskly reactive to light. Extraocular movements full without nystagmus. Visual fields full to confrontation. Hearing diminished on the right. Facial sensation intact. Face, tongue, palate moves normally and symmetrically.  Motor: Normal bulk and tone. Normal strength in all tested extremity muscles. Sensory.: intact to touch and pinprick and vibratory sensation.  Coordination: Rapid alternating movements normal in all extremities. Finger-to-nose and heel-to-shin performed accurately bilaterally.   Gait and Station: Arises from chair without difficulty.  Stance is normal. Gait demonstrates normal stride length and balance . Not able to heel, toe and tandem walk without difficulty.  Reflexes: 1+ and symmetric except ankle jerks are depressed. Toes downgoing.      ASSESSMENT: [age] year Caucasian male with episode of sudden onset dizziness and gait ataxia without any other lateralizing signs. thought  to be a small cerebellar infarct not visualized on MRI in July 2014. This occurred 2 days after admission for workup for her syncopal episode in [place]. Negative neurovascular workup and vascular risk factors of hypertension hyperlipidemia coronary artery disease. Mild age-related cognitive  impairment which is stable for several years. Recurrent transient sharp bitemporal headaches with negative temporal artery biopsy Paroxysmal atrial fibrillation/flutter-started on Eliquis recently by Dr Rennis Golden but has had recurrent posterior circulation TIAs despite anticoagulation Mild cognitive impairment which is age related and mildy increased    PLAN:  I had a long discussion with the patient and his son regarding his mild cognitive impairment and memory loss and recommend he start taking Cerefolin NAC 1 tablet daily as well as Reserveratrol 200 mg daily. I also encouraged him to increase participation in cognitively challenging activities like solving crossword puzzles, sudoku and playing bridge. Continue Eliquis for stroke prevention due to history of atrial fibrillation and strict control of hypertension with blood pressure goal below 130/90. Return for follow-up in 6 months or call earlier if necessary. Delia Heady, MD

## 2014-09-22 NOTE — Patient Instructions (Signed)
I had a long discussion with the patient and his son regarding his mild cognitive impairment and memory loss and recommend he start taking Cerefolin NAC 1 tablet daily as well as Reserveratrol 200 mg daily. I also encouraged him to increase participation in cognitively challenging activities like solving crossword puzzles, sudoku and playing bridge. Continue Eliquis for stroke prevention due to history of atrial fibrillation and strict control of hypertension with blood pressure goal below 130/90. Return for follow-up in 6 months or call earlier if necessary. Memory Compensation Strategies  1. Use "WARM" strategy.  W= write it down  A= associate it  R= repeat it  M= make a mental note  2.   You can keep a Glass blower/designerMemory Notebook.  Use a 3-ring notebook with sections for the following: calendar, important names and phone numbers,  medications, doctors' names/phone numbers, lists/reminders, and a section to journal what you did  each day.   3.    Use a calendar to write appointments down.  4.    Write yourself a schedule for the day.  This can be placed on the calendar or in a separate section of the Memory Notebook.  Keeping a  regular schedule can help memory.  5.    Use medication organizer with sections for each day or morning/evening pills.  You may need help loading it  6.    Keep a basket, or pegboard by the door.  Place items that you need to take out with you in the basket or on the pegboard.  You may also want to  include a message board for reminders.  7.    Use sticky notes.  Place sticky notes with reminders in a place where the task is performed.  For example: " turn off the  stove" placed by the stove, "lock the door" placed on the door at eye level, " take your medications" on  the bathroom mirror or by the place where you normally take your medications.  8.    Use alarms/timers.  Use while cooking to remind yourself to check on food or as a reminder to take your medicine, or as a   reminder to make a call, or as a reminder to perform another task, etc.

## 2014-09-28 ENCOUNTER — Other Ambulatory Visit: Payer: Self-pay | Admitting: *Deleted

## 2014-09-28 MED ORDER — METOPROLOL TARTRATE 25 MG PO TABS: 25.0000 mg | ORAL_TABLET | Freq: Two times a day (BID) | ORAL | Status: DC

## 2014-09-28 NOTE — Telephone Encounter (Signed)
Patient's wife seen in office for appointment - requested printed Rx for her husband's metoprolol

## 2014-10-07 ENCOUNTER — Encounter (HOSPITAL_COMMUNITY): Payer: Self-pay | Admitting: Emergency Medicine

## 2014-10-07 ENCOUNTER — Inpatient Hospital Stay (HOSPITAL_COMMUNITY)
Admission: EM | Admit: 2014-10-07 | Discharge: 2014-10-08 | DRG: 069 | Disposition: A | Discharge status: Home / Self Care | Payer: Medicare Other | Attending: Internal Medicine | Admitting: Internal Medicine

## 2014-10-07 ENCOUNTER — Emergency Department (HOSPITAL_COMMUNITY): Payer: Medicare Other

## 2014-10-07 ENCOUNTER — Inpatient Hospital Stay (HOSPITAL_COMMUNITY): Payer: Medicare Other

## 2014-10-07 DIAGNOSIS — I252 Old myocardial infarction: Secondary | ICD-10-CM | POA: Diagnosis not present

## 2014-10-07 DIAGNOSIS — G319 Degenerative disease of nervous system, unspecified: Secondary | ICD-10-CM | POA: Diagnosis not present

## 2014-10-07 DIAGNOSIS — R4702 Dysphasia: Secondary | ICD-10-CM | POA: Diagnosis not present

## 2014-10-07 DIAGNOSIS — I639 Cerebral infarction, unspecified: Secondary | ICD-10-CM | POA: Diagnosis not present

## 2014-10-07 DIAGNOSIS — Z8673 Personal history of transient ischemic attack (TIA), and cerebral infarction without residual deficits: Secondary | ICD-10-CM

## 2014-10-07 DIAGNOSIS — Z7902 Long term (current) use of antithrombotics/antiplatelets: Secondary | ICD-10-CM | POA: Diagnosis not present

## 2014-10-07 DIAGNOSIS — I6782 Cerebral ischemia: Secondary | ICD-10-CM | POA: Diagnosis not present

## 2014-10-07 DIAGNOSIS — I4510 Unspecified right bundle-branch block: Secondary | ICD-10-CM | POA: Diagnosis present

## 2014-10-07 DIAGNOSIS — I10 Essential (primary) hypertension: Secondary | ICD-10-CM | POA: Diagnosis present

## 2014-10-07 DIAGNOSIS — R4781 Slurred speech: Secondary | ICD-10-CM | POA: Diagnosis not present

## 2014-10-07 DIAGNOSIS — G459 Transient cerebral ischemic attack, unspecified: Secondary | ICD-10-CM | POA: Insufficient documentation

## 2014-10-07 DIAGNOSIS — I2510 Atherosclerotic heart disease of native coronary artery without angina pectoris: Secondary | ICD-10-CM | POA: Diagnosis present

## 2014-10-07 DIAGNOSIS — I6789 Other cerebrovascular disease: Secondary | ICD-10-CM | POA: Diagnosis not present

## 2014-10-07 DIAGNOSIS — I63541 Cerebral infarction due to unspecified occlusion or stenosis of right cerebellar artery: Secondary | ICD-10-CM | POA: Diagnosis not present

## 2014-10-07 DIAGNOSIS — Z888 Allergy status to other drugs, medicaments and biological substances status: Secondary | ICD-10-CM | POA: Diagnosis not present

## 2014-10-07 DIAGNOSIS — Z881 Allergy status to other antibiotic agents status: Secondary | ICD-10-CM | POA: Diagnosis not present

## 2014-10-07 DIAGNOSIS — E785 Hyperlipidemia, unspecified: Secondary | ICD-10-CM | POA: Diagnosis present

## 2014-10-07 DIAGNOSIS — Z955 Presence of coronary angioplasty implant and graft: Secondary | ICD-10-CM

## 2014-10-07 DIAGNOSIS — Z79899 Other long term (current) drug therapy: Secondary | ICD-10-CM | POA: Diagnosis not present

## 2014-10-07 DIAGNOSIS — I480 Paroxysmal atrial fibrillation: Secondary | ICD-10-CM | POA: Diagnosis present

## 2014-10-07 DIAGNOSIS — E871 Hypo-osmolality and hyponatremia: Secondary | ICD-10-CM

## 2014-10-07 LAB — COMPREHENSIVE METABOLIC PANEL
ALT: 20 U/L (ref 17–63)
AST: 25 U/L (ref 15–41)
Albumin: 4 g/dL (ref 3.5–5.0)
Alkaline Phosphatase: 51 U/L (ref 38–126)
Anion gap: 8 (ref 5–15)
BILIRUBIN TOTAL: 1.1 mg/dL (ref 0.3–1.2)
BUN: 13 mg/dL (ref 6–20)
CO2: 24 mmol/L (ref 22–32)
CREATININE: 0.9 mg/dL (ref 0.61–1.24)
Calcium: 9.1 mg/dL (ref 8.9–10.3)
Chloride: 99 mmol/L — ABNORMAL LOW (ref 101–111)
GFR calc Af Amer: >60 mL/min (ref >60)
GFR calc non Af Amer: >60 mL/min (ref >60)
Glucose, Bld: 116 mg/dL — ABNORMAL HIGH (ref 65–99)
Potassium: 4.2 mmol/L (ref 3.5–5.1)
Sodium: 131 mmol/L — ABNORMAL LOW (ref 135–145)
Total Protein: 6.3 g/dL — ABNORMAL LOW (ref 6.5–8.1)

## 2014-10-07 LAB — URINALYSIS, ROUTINE W REFLEX MICROSCOPIC
Bilirubin Urine: NEGATIVE
Glucose, UA: NEGATIVE mg/dL
HGB URINE DIPSTICK: NEGATIVE
Ketones, ur: NEGATIVE mg/dL
LEUKOCYTES UA: NEGATIVE
NITRITE: NEGATIVE
PROTEIN: NEGATIVE mg/dL
SPECIFIC GRAVITY, URINE: 1.008 (ref 1.005–1.030)
UROBILINOGEN UA: 0.2 mg/dL (ref 0.0–1.0)
pH: 6.5 (ref 5.0–8.0)

## 2014-10-07 LAB — CBC
HEMATOCRIT: 41.4 % (ref 39.0–52.0)
Hemoglobin: 14.6 g/dL (ref 13.0–17.0)
MCH: 32.6 pg (ref 26.0–34.0)
MCHC: 35.3 g/dL (ref 30.0–36.0)
MCV: 92.4 fL (ref 78.0–100.0)
Platelets: 156 10*3/uL (ref 150–400)
RBC: 4.48 MIL/uL (ref 4.22–5.81)
RDW: 13.1 % (ref 11.5–15.5)
WBC: 7.5 10*3/uL (ref 4.0–10.5)

## 2014-10-07 LAB — DIFFERENTIAL
BASOS PCT: 0 % (ref 0–1)
Basophils Absolute: 0 10*3/uL (ref 0.0–0.1)
EOS PCT: 2 % (ref 0–5)
Eosinophils Absolute: 0.2 10*3/uL (ref 0.0–0.7)
Lymphocytes Relative: 24 % (ref 12–46)
Lymphs Abs: 1.8 10*3/uL (ref 0.7–4.0)
MONOS PCT: 6 % (ref 3–12)
Monocytes Absolute: 0.5 10*3/uL (ref 0.1–1.0)
Neutro Abs: 5.1 10*3/uL (ref 1.7–7.7)
Neutrophils Relative %: 68 % (ref 43–77)

## 2014-10-07 LAB — I-STAT TROPONIN, ED: Troponin i, poc: 0 ng/mL (ref 0.00–0.08)

## 2014-10-07 LAB — RAPID URINE DRUG SCREEN, HOSP PERFORMED
Amphetamines: NOT DETECTED
BENZODIAZEPINES: NOT DETECTED
Barbiturates: NOT DETECTED
Cocaine: NOT DETECTED
OPIATES: NOT DETECTED
Tetrahydrocannabinol: NOT DETECTED

## 2014-10-07 LAB — APTT: aPTT: 28 seconds (ref 24–37)

## 2014-10-07 LAB — ETHANOL: Alcohol, Ethyl (B): <5 mg/dL (ref <5)

## 2014-10-07 LAB — PROTIME-INR
INR: 1.15 (ref 0.00–1.49)
Prothrombin Time: 14.8 seconds (ref 11.6–15.2)

## 2014-10-07 MED ORDER — ACETAMINOPHEN 650 MG RE SUPP: 650.0000 mg | RECTAL | Status: DC | PRN

## 2014-10-07 MED ORDER — ONE-DAILY MULTI VITAMINS PO TABS: 1.0000 | ORAL_TABLET | Freq: Every day | ORAL | Status: DC

## 2014-10-07 MED ORDER — ACETAMINOPHEN 325 MG PO TABS: 650.0000 mg | ORAL_TABLET | ORAL | Status: DC | PRN

## 2014-10-07 MED ORDER — LORAZEPAM 0.5 MG PO TABS
0.5000 mg | ORAL_TABLET | Freq: Every day | ORAL | Status: DC
  Administered 2014-10-07: 0.5 mg via ORAL
  Filled 2014-10-07: qty 1

## 2014-10-07 MED ORDER — ADULT MULTIVITAMIN W/MINERALS CH
1.0000 | ORAL_TABLET | Freq: Every day | ORAL | Status: DC
  Administered 2014-10-08: 1 via ORAL
  Filled 2014-10-07: qty 1

## 2014-10-07 MED ORDER — ONDANSETRON HCL 4 MG/2ML IJ SOLN: 4.0000 mg | Freq: Four times a day (QID) | INTRAMUSCULAR | Status: DC | PRN

## 2014-10-07 MED ORDER — APIXABAN 5 MG PO TABS
5.0000 mg | ORAL_TABLET | Freq: Two times a day (BID) | ORAL | Status: DC
  Administered 2014-10-07 – 2014-10-08 (×2): 5 mg via ORAL
  Filled 2014-10-07 (×2): qty 1

## 2014-10-07 MED ORDER — SODIUM CHLORIDE 1 G PO TABS
1.0000 g | ORAL_TABLET | Freq: Two times a day (BID) | ORAL | Status: DC
  Administered 2014-10-07 – 2014-10-08 (×2): 1 g via ORAL
  Filled 2014-10-07 (×4): qty 1

## 2014-10-07 MED ORDER — STROKE: EARLY STAGES OF RECOVERY BOOK
Freq: Once | Status: AC
  Administered 2014-10-07: 17:00:00

## 2014-10-07 MED ORDER — ACETAMINOPHEN 325 MG PO TABS
650.0000 mg | ORAL_TABLET | Freq: Once | ORAL | Status: DC
  Filled 2014-10-07: qty 2

## 2014-10-07 NOTE — Consult Note (Signed)
Neurology Consultation Reason for Consult: TIA Referring Physician: Tat, D  CC: Speech difficulty  History is obtained from: Patient, family  HPI: Wyatt [name] is a 81 y.o. male with a history of previous stroke and TIA who presents with transient episode of inability to get his words out. He states that he was talking to a neighbor over the fence and then began having difficulty with speaking. He did not have any trouble with understanding what other people or saying during this time. He did not notice any other symptoms.  Lkw: 12 pm  TPA given: No, resolved symptoms   ROS: A 14 point ROS was performed and is negative except as noted in the HPI.   Past Medical History  Diagnosis Date  . Hypertension   . Hyperlipemia   . Atrial fibrillation   . Personal history of colonic polyps 04/2009    SERRATED ADENOMA  . Cardiac arrhythmia   . Diverticulosis   . Anxiety   . Cataract   . Lymphocytic colitis   . CAD (coronary artery disease)   . Inferior MI 2001    stent to RCA-Dr. Juanda ChanceBrodie  . CVA (cerebral infarction)     while in [place]  . Syncope and collapse 2014    in [place] prior to the stroke by 24 hours.    Family History: Cancer  Social History: Tob: Quit many years ago  Exam: Current vital signs: BP 169/78 mmHg  Pulse 69  Temp(Src) 97.9 F (36.6 C) (Oral)  Resp 18  SpO2 100% Vital signs in last 24 hours: Temp:  [97.9 F (36.6 C)-98.3 F (36.8 C)] 97.9 F (36.6 C) (05/14 1537) Pulse Rate:  [57-69] 69 (05/14 1537) Resp:  [15-20] 18 (05/14 1537) BP: (131-199)/(53-92) 169/78 mmHg (05/14 1537) SpO2:  [97 %-100 %] 100 % (05/14 1537)   Physical Exam  Constitutional: Appears well-developed and well-nourished.  Psych: Affect appropriate to situation Eyes: No scleral injection HENT: No OP obstrucion Head: Normocephalic.  Cardiovascular: Normal rate and regular rhythm.  Respiratory: Effort normal and breath sounds normal to anterior ascultation GI: Soft.  No  distension. There is no tenderness.  Skin: WDI  Neuro: Mental Status: Patient is awake, alert, oriented to person, place, month, year, and situation. Patient is able to give a clear and coherent history. No signs of aphasia or neglect Cranial Nerves: II: Visual Fields are full. Pupils are equal, round, and reactive to light.   III,IV, VI: EOMI without ptosis or diploplia.  V: Facial sensation is symmetric to temperature VII: Facial movement is symmetric VIII: hearing is intact to voice X: Uvula elevates symmetrically XI: Shoulder shrug is symmetric. XII: tongue is midline without atrophy or fasciculations.  Motor: Tone is normal. Bulk is normal. 5/5 strength was present in all four extremities with the exception of a possible mild weakness in his right grip, he does have a mild right pronator drift. Sensory: Sensation is symmetric to light touch and temperature in the arms and legs. Deep Tendon Reflexes: 2+ and symmetric in the biceps and patellae.  Cerebellar: FNF intact bilaterally  I have reviewed labs in epic and the results pertinent to this consultation are: CMP-unremarkable  I have reviewed the images obtained:CT head - no acute findings  Impression: 81 yo M with likely TIA. He is treated with eliquis, but would liek to make sure that stroke prevention is optimized.   Recommendations: 1. HgbA1c, fasting lipid panel 2. MRI, MRA  of the brain without contrast 3. Frequent neuro  checks 4. Echocardiogram 5. Carotid dopplers 6. Prophylactic therapy- eliquis 7. Risk factor modification 8. Telemetry monitoring 9. PT consult, OT consult, Speech consult    Ritta SlotMcNeill Daryle Amis, MD [facility] [phone]  If 7pm- 7am, please page neurology on call as listed in AMION.

## 2014-10-07 NOTE — ED Notes (Signed)
Admitting physician at the bedside.

## 2014-10-07 NOTE — ED Notes (Signed)
Attempted report x1. 

## 2014-10-07 NOTE — ED Notes (Signed)
Per EMS, pt was working in the yard when his neighbor walked up to talk to him and he had slurred speech with a sudden onset of a temporal headache around 10:00. EMS was called and upon their arrival pt had clear speech. Pt has hx of aphasia and baseline left side neglect from the previous stroke 2 years ago. Pt alert x4. GCS: 15. BP:172/96, HR: 60, CBG: 93, RR:17, SpO2: 99%

## 2014-10-07 NOTE — H&P (Signed)
History and Physical  Wyatt [name]:[id number] DOB: December 08, 1928 DOA: 10/07/2014   PCP: Michiel Sites, MD  Referring Physician: ED/ Dr. Romeo Apple  Chief Complaint: Word finding difficulty  HPI:  81 year old male with a history of paroxysmal atrial fibrillation, stroke, coronary artery disease with a history of stent to RCA, hyperlipidemia presented with expressive aphasia on the morning of admission. The patient has been in his usual state of health until the morning of 10/07/2014. The patient was standing on his fence talking to his neighbor when he suddenly developed word finding difficulties. He stated that the episode lasted about 10-15 minutes after which he was back to normal. The patient denied any dizziness, visual disturbance, focal extremity weakness, syncope, chest discomfort, shortness of breath, nausea, vomiting, diaphoresis. He did have a bitemporal headache at this time which has improved but not completely resolved. Upon presentation to the emergency department, the patient was back to his baseline. The patient has not started any new medications. In the emergency department, the patient's vital signs were stable. BMP showed sodium 131, hepatic enzymes were unremarkable. CBC was unremarkable. Urinalysis was negative for pyuria. Point-of-care troponin was negative. EKG showed sinus rhythm with unchanged right bundle branch block. CT of the brain was negative for acute findings. Assessment/Plan: Expressive aphasia -Concern about TIA and stroke -Neurology has been consulted and will see the patient  -MRI/MRA brain  -Carotid duplex  -Echocardiogram  -Hemoglobin A1c, lipid panel  -PT/OT/ST Paroxysmal atrial fibrillation  -Presently in sinus rhythm  -Rate controlled  -Continue apixiban  -Continue metoprolol tartrate  -TSH Hyponatremia  -This has been chronic with sodium as low as 129  -Resume home dose of NaCl tabs -TSH Hyperlipidemia  -Patient states that he  is no longer taking Lipitor  -Recheck lipids  Coronary artery disease with history of RCA stent  -No chest pain presently  -Continue apixiban        Past Medical History  Diagnosis Date  . Hypertension   . Hyperlipemia   . Atrial fibrillation   . Personal history of colonic polyps 04/2009    SERRATED ADENOMA  . Cardiac arrhythmia   . Diverticulosis   . Anxiety   . Cataract   . Lymphocytic colitis   . CAD (coronary artery disease)   . Inferior MI 2001    stent to RCA-Dr. Juanda Chance  . CVA (cerebral infarction)     while in [place]  . Syncope and collapse 2014    in [place] prior to the stroke by 24 hours.   Past Surgical History  Procedure Laterality Date  . Coronary angioplasty with stent placement  2001    Dr. Eden Emms  . Total knee arthroplasty     Social History:  reports that he has never smoked. He has never used smokeless tobacco. He reports that he does not drink alcohol or use illicit drugs.   Family History  Problem Relation Age of Onset  . Colon cancer Neg Hx   . Liver cancer Father   . Cancer Sister   . Cancer Sister      Allergies  Allergen Reactions  . Ampicillin Other (See Comments)    REACTION: unspecified  . Metronidazole Other (See Comments)    Burning skin and hands      Prior to Admission medications   Medication Sig Start Date End Date Taking? Authorizing Provider  ATORVASTATIN CALCIUM PO Take 1 tablet by mouth daily.    Yes Historical Provider, MD  ELIQUIS 5 MG  TABS tablet TAKE 1 TABLET BY MOUTH TWICE DAILY TAKE 1 TABLET BY MOUTH TWICE DAILY Patient taking differently: TAKE 1 TABLET BY MOUTH TWICE DAILY 08/28/14  Yes Chrystie NoseKenneth C Hilty, MD  lisinopril (PRINIVIL,ZESTRIL) 10 MG tablet TAKE 1 TABLET BY MOUTH DAILY 05/02/14  Yes Chrystie NoseKenneth C Hilty, MD  LORazepam (ATIVAN) 0.5 MG tablet Take 0.5 mg by mouth at bedtime.    Yes Historical Provider, MD  metoprolol tartrate (LOPRESSOR) 25 MG tablet Take 1 tablet (25 mg total) by mouth 2 (two) times daily.  09/28/14  Yes Chrystie NoseKenneth C Hilty, MD  Multiple Vitamin (MULTIVITAMIN) tablet Take 1 tablet by mouth daily.     Yes Historical Provider, MD  sodium chloride 1 G tablet Take 1 g by mouth 2 (two) times daily.   Yes Historical Provider, MD  L-Methylfolate-B12-B6-B2 (CEREFOLIN) 10-24-48-5 MG TABS Take 1 tablet by mouth 1 day or 1 dose. Patient not taking: Reported on 10/07/2014 09/22/14   Micki RileyPramod S Sethi, MD    Review of Systems:  Constitutional:  No weight loss, night sweats, Fevers, chills, fatigue.  Head&Eyes: No headache.  No vision loss.  No eye pain or scotoma ENT:  No Difficulty swallowing,Tooth/dental problems,Sore throat,   Cardio-vascular:  No chest pain, Orthopnea, PND, swelling in lower extremities,  dizziness, palpitations  GI:  No  abdominal pain, nausea, vomiting, diarrhea, loss of appetite, hematochezia, melena, heartburn, indigestion, Resp:  No shortness of breath with exertion or at rest. No cough. No coughing up of blood .No wheezing.No chest wall deformity  Skin:  no rash or lesions.  GU:  no dysuria, change in color of urine, no urgency or frequency. No flank pain.  Musculoskeletal:  No joint pain or swelling. No decreased range of motion. No back pain.  Psych:  No change in mood or affect. No depression or anxiety. Neurologic: No headache, no dysesthesia, no focal weakness, no vision loss. No syncope  Physical Exam: Filed Vitals:   10/07/14 1225 10/07/14 1300 10/07/14 1400 10/07/14 1537  BP:  131/77 132/53 169/78  Pulse:  57 66 69  Temp: 98 F (36.7 C)   97.9 F (36.6 C)  TempSrc:    Oral  Resp:  17 20 18   SpO2:  99% 97% 100%   General:  A&O x 3, NAD, nontoxic, pleasant/cooperative Head/Eye: No conjunctival hemorrhage, no icterus, [place]/AT, No nystagmus ENT:  No icterus,  No thrush, good dentition, no pharyngeal exudate Neck:  No masses, no lymphadenpathy, no bruits CV:  RRR, no rub, no gallop, no S3 Lung:  CTAB, good air movement, no wheeze, no rhonchi Abdomen:  soft/NT, +BS, nondistended, no peritoneal signs Ext: No cyanosis, No rashes, No petechiae, No lymphangitis, No edema Neuro: CNII-XII intact, strength 4/5 in bilateral upper and lower extremities, no dysmetria  Labs on Admission:  Basic Metabolic Panel:  Recent Labs Lab 10/07/14 1223  NA 131*  K 4.2  CL 99*  CO2 24  GLUCOSE 116*  BUN 13  CREATININE 0.90  CALCIUM 9.1   Liver Function Tests:  Recent Labs Lab 10/07/14 1223  AST 25  ALT 20  ALKPHOS 51  BILITOT 1.1  PROT 6.3*  ALBUMIN 4.0   No results for input(s): LIPASE, AMYLASE in the last 168 hours. No results for input(s): AMMONIA in the last 168 hours. CBC:  Recent Labs Lab 10/07/14 1223  WBC 7.5  NEUTROABS 5.1  HGB 14.6  HCT 41.4  MCV 92.4  PLT 156   Cardiac Enzymes: No results for input(s): CKTOTAL, CKMB,  CKMBINDEX, TROPONINI in the last 168 hours. BNP: Invalid input(s): POCBNP CBG: No results for input(s): GLUCAP in the last 168 hours.  Radiological Exams on Admission: Ct Head Wo Contrast  10/07/2014   CLINICAL DATA:  Slurred speech 1.5 hours ago, now resolved. History of prior CVA.  EXAM: CT HEAD WITHOUT CONTRAST  TECHNIQUE: Contiguous axial images were obtained from the base of the skull through the vertex without intravenous contrast.  COMPARISON:  07/20/2012; 07/12/2012  FINDINGS: Similar findings of advanced atrophy with sulcal prominence and prominence of the bifrontal extra-axial spaces. Re- demonstrated extensive periventricular hypodensities compatible with microvascular ischemic disease. Old lacunar infarct within the left basal ganglia (image 15, series 2). Unchanged size and configuration of the ventricles and basilar cisterns. No midline shift. Intracranial atherosclerosis. A tiny (approximately 4 mm) osteoid osteoma is noted within the right frontal sinus, unchanged. Otherwise, normal appearance of the paranasal sinuses and mastoid air cells. No air-fluid levels. Debris is noted within the left  external auditory canal. Regional soft tissues appear normal. Post bilateral cataract surgery. No displaced calvarial fracture.  IMPRESSION: Similar findings of atrophy and microvascular ischemic disease without acute intracranial process.   Electronically Signed   By: Simonne ComeJohn  Watts M.D.   On: 10/07/2014 12:48    EKG: Independently reviewed. Sinus rhythm, right bundle branch block    Time spent:60 minutes Code Status:   FULL Family Communication:   Sons update at bedside   Aireona Torelli, DO  [facility] Pager [phone]  If 7PM-7AM, please contact night-coverage [email or web address] Password [email or web address] 10/07/2014, 5:28 PM

## 2014-10-07 NOTE — ED Provider Notes (Signed)
CSN: [id number]     Arrival date & time 10/07/14  1143 History   First MD Initiated Contact with Patient 10/07/14 1144     Chief Complaint  Patient presents with  . Stroke Symptoms     (Consider location/radiation/quality/duration/timing/severity/associated sxs/prior Treatment) Patient is a 81 y.o. male presenting with neurologic complaint. The history is provided by the patient.  Neurologic Problem This is a new problem. The current episode started 1 to 2 hours ago. Episode frequency: once. The problem has been resolved. Pertinent negatives include no chest pain, no abdominal pain, no headaches and no shortness of breath. Nothing aggravates the symptoms. Nothing relieves the symptoms. He has tried nothing for the symptoms. The treatment provided significant relief.    Past Medical History  Diagnosis Date  . Hypertension   . Hyperlipemia   . Atrial fibrillation   . Personal history of colonic polyps 04/2009    SERRATED ADENOMA  . Cardiac arrhythmia   . Diverticulosis   . Anxiety   . Cataract   . Lymphocytic colitis   . CAD (coronary artery disease)   . Inferior MI 2001    stent to RCA-Dr. Juanda ChanceBrodie  . CVA (cerebral infarction)     while in [place]  . Syncope and collapse 2014    in [place] prior to the stroke [place] 24 hours.   Past Surgical History  Procedure Laterality Date  . Coronary angioplasty with stent placement  2001    Dr. Eden EmmsNishan  . Total knee arthroplasty     Family History  Problem Relation Age of Onset  . Colon cancer Neg Hx   . Liver cancer Father   . Cancer Sister   . Cancer Sister    History  Substance Use Topics  . Smoking status: Never Smoker   . Smokeless tobacco: Never Used  . Alcohol Use: No    Review of Systems  Constitutional: Negative for fever.  HENT: Negative for drooling and rhinorrhea.   Eyes: Negative for pain.  Respiratory: Negative for cough and shortness of breath.   Cardiovascular: Negative for chest pain and leg swelling.   Gastrointestinal: Negative for nausea, vomiting, abdominal pain and diarrhea.  Genitourinary: Negative for dysuria and hematuria.  Musculoskeletal: Negative for gait problem and neck pain.  Skin: Negative for color change.  Neurological: Positive for speech difficulty. Negative for numbness and headaches.  Hematological: Negative for adenopathy.  Psychiatric/Behavioral: Negative for behavioral problems.  All other systems reviewed and are negative.     Allergies  Ampicillin and Metronidazole  Home Medications   Prior to Admission medications   Medication Sig Start Date End Date Taking? Authorizing Provider  ATORVASTATIN CALCIUM PO Take by mouth daily.    Historical Provider, MD  ELIQUIS 5 MG TABS tablet TAKE 1 TABLET BY MOUTH TWICE DAILY TAKE 1 TABLET BY MOUTH TWICE DAILY 08/28/14   Chrystie NoseKenneth C Hilty, MD  L-Methylfolate-B12-B6-B2 (CEREFOLIN) 10-24-48-5 MG TABS Take 1 tablet by mouth 1 day or 1 dose. 09/22/14   Micki RileyPramod S Sethi, MD  lisinopril (PRINIVIL,ZESTRIL) 10 MG tablet TAKE 1 TABLET BY MOUTH DAILY 05/02/14   Chrystie NoseKenneth C Hilty, MD  LORazepam (ATIVAN) 0.5 MG tablet Take 0.5 mg by mouth as needed for anxiety.    Historical Provider, MD  metoprolol tartrate (LOPRESSOR) 25 MG tablet Take 1 tablet (25 mg total) by mouth 2 (two) times daily. 09/28/14   Chrystie NoseKenneth C Hilty, MD  Multiple Vitamin (MULTIVITAMIN) tablet Take 1 tablet by mouth daily.      Historical  Provider, MD  sodium chloride 1 G tablet Take 1 g by mouth 2 (two) times daily.    Historical Provider, MD   BP 199/92 mmHg  Pulse 68  Temp(Src) 98.3 F (36.8 C) (Oral)  Resp 15  SpO2 100% Physical Exam  Constitutional: He is oriented to person, place, and time. He appears well-developed and well-nourished.  HENT:  Head: Normocephalic and atraumatic.  Right Ear: External ear normal.  Left Ear: External ear normal.  Nose: Nose normal.  Mouth/Throat: Oropharynx is clear and moist. No oropharyngeal exudate.  Eyes: Conjunctivae and EOM are  normal. Pupils are equal, round, and reactive to light.  Neck: Normal range of motion. Neck supple.  Cardiovascular: Normal rate, regular rhythm, normal heart sounds and intact distal pulses.  Exam reveals no gallop and no friction rub.   No murmur heard. Pulmonary/Chest: Effort normal and breath sounds normal. No respiratory distress. He has no wheezes.  Abdominal: Soft. Bowel sounds are normal. He exhibits no distension. There is no tenderness. There is no rebound and no guarding.  Musculoskeletal: Normal range of motion. He exhibits no edema or tenderness.  Neurological: He is alert and oriented to person, place, and time.  alert, oriented x3 speech: normal in context and clarity memory: intact grossly cranial nerves II-XII: intact motor strength: full proximally and distally sensation: intact to light touch diffusely  cerebellar: finger-to-nose and heel-to-shin intact gait: normal forwards and backwards  Skin: Skin is warm and dry.  Psychiatric: He has a normal mood and affect. His behavior is normal.  Nursing note and vitals reviewed.   ED Course  Procedures (including critical care time) Labs Review Labs Reviewed  COMPREHENSIVE METABOLIC PANEL - Abnormal; Notable for the following:    Sodium 131 (*)    Chloride 99 (*)    Glucose, Bld 116 (*)    Total Protein 6.3 (*)    All other components within normal limits  ETHANOL  PROTIME-INR  APTT  CBC  DIFFERENTIAL  URINE RAPID DRUG SCREEN (HOSP PERFORMED)  URINALYSIS, ROUTINE W REFLEX MICROSCOPIC  I-STAT TROPOININ, ED    Imaging Review Ct Head Wo Contrast  10/07/2014   CLINICAL DATA:  Slurred speech 1.5 hours ago, now resolved. History of prior CVA.  EXAM: CT HEAD WITHOUT CONTRAST  TECHNIQUE: Contiguous axial images were obtained from the base of the skull through the vertex without intravenous contrast.  COMPARISON:  07/20/2012; 07/12/2012  FINDINGS: Similar findings of advanced atrophy with sulcal prominence and prominence  of the bifrontal extra-axial spaces. Re- demonstrated extensive periventricular hypodensities compatible with microvascular ischemic disease. Old lacunar infarct within the left basal ganglia (image 15, series 2). Unchanged size and configuration of the ventricles and basilar cisterns. No midline shift. Intracranial atherosclerosis. A tiny (approximately 4 mm) osteoid osteoma is noted within the right frontal sinus, unchanged. Otherwise, normal appearance of the paranasal sinuses and mastoid air cells. No air-fluid levels. Debris is noted within the left external auditory canal. Regional soft tissues appear normal. Post bilateral cataract surgery. No displaced calvarial fracture.  IMPRESSION: Similar findings of atrophy and microvascular ischemic disease without acute intracranial process.   Electronically Signed   By: Simonne Come M.D.   On: 10/07/2014 12:48     EKG Interpretation   Date/Time:  Saturday Oct 07 2014 11:47:20 EDT Ventricular Rate:  68 PR Interval:  181 QRS Duration: 132 QT Interval:  438 QTC Calculation: 466 R Axis:   56 Text Interpretation:  Sinus rhythm Right bundle branch block Minimal ST  elevation, anterior leads No significant change since last tracing  Confirmed by Delorus Langwell  MD, Jibreel Fedewa ([id number]) on 10/07/2014 1:59:02 PM      MDM   Final diagnoses:  Transient cerebral ischemia, unspecified transient cerebral ischemia type    12:12 PM 81 y.o. male w hx of afib on eliquis, htn, CAD s/p stent, previous CVA w hx of word finding issues who presents with expressive aphasia which began while talking to his neighbor around 10 AM this morning. Symptoms lasted about 10-15 minutes and then resolved. The patient has no complaints on exam. Vital signs unremarkable here. NIHSS of 0.   Will admit to hospitalist for TIA workup.     Purvis SheffieldForrest Kimberly Nieland, MD 10/07/14 [phone]

## 2014-10-07 NOTE — ED Notes (Signed)
Patient transported to CT 

## 2014-10-07 NOTE — Progress Notes (Signed)
Patient arrived via ER in the company of hospital staff via stretcher. Patient alert and oriented x 4. Family at bedside. Patient oriented to the room and unit policy regarding safety explained to the patient. Daughter requested for the bed alarm to be turned off as they are still with the patient, but will let staff know if they are ready to leave. Will continue to monitor.

## 2014-10-08 ENCOUNTER — Inpatient Hospital Stay (HOSPITAL_COMMUNITY): Payer: Medicare Other

## 2014-10-08 DIAGNOSIS — G459 Transient cerebral ischemic attack, unspecified: Secondary | ICD-10-CM

## 2014-10-08 DIAGNOSIS — I6789 Other cerebrovascular disease: Secondary | ICD-10-CM

## 2014-10-08 DIAGNOSIS — R4702 Dysphasia: Secondary | ICD-10-CM

## 2014-10-08 DIAGNOSIS — E871 Hypo-osmolality and hyponatremia: Secondary | ICD-10-CM

## 2014-10-08 LAB — BASIC METABOLIC PANEL
ANION GAP: 10 (ref 5–15)
BUN: 10 mg/dL (ref 6–20)
CO2: 27 mmol/L (ref 22–32)
Calcium: 9.6 mg/dL (ref 8.9–10.3)
Chloride: 97 mmol/L — ABNORMAL LOW (ref 101–111)
Creatinine, Ser: 0.92 mg/dL (ref 0.61–1.24)
GLUCOSE: 119 mg/dL — AB (ref 65–99)
Potassium: 4 mmol/L (ref 3.5–5.1)
SODIUM: 134 mmol/L — AB (ref 135–145)

## 2014-10-08 LAB — LIPID PANEL
Cholesterol: 182 mg/dL (ref 0–200)
HDL: 58 mg/dL (ref >40)
LDL CALC: 101 mg/dL — AB (ref 0–99)
TRIGLYCERIDES: 114 mg/dL (ref <150)
Total CHOL/HDL Ratio: 3.1 RATIO
VLDL: 23 mg/dL (ref 0–40)

## 2014-10-08 LAB — TSH: TSH: 0.788 u[IU]/mL (ref 0.350–4.500)

## 2014-10-08 LAB — VITAMIN B12: Vitamin B-12: 644 pg/mL (ref 180–914)

## 2014-10-08 MED ORDER — ATORVASTATIN CALCIUM 20 MG PO TABS: 20.0000 mg | ORAL_TABLET | Freq: Every day | ORAL | Status: DC

## 2014-10-08 MED ORDER — DIVALPROEX SODIUM 125 MG PO CSDR: 500.0000 mg | DELAYED_RELEASE_CAPSULE | Freq: Two times a day (BID) | ORAL | Status: DC

## 2014-10-08 NOTE — Progress Notes (Signed)
  Echocardiogram 2D Echocardiogram has been performed.  Wyatt [name] 10/08/2014, 11:15 AM

## 2014-10-08 NOTE — Progress Notes (Signed)
PT Cancellation Note  Patient Details Name: Wallis Bambergeter A Keeny MRN: [id number] DOB: 06/04/1928   Cancelled Treatment:    Reason Eval/Treat Not Completed: PT screened, no needs identified, will sign off  Ambulates generally well without an assistive device and tolerated challenging dynamic gait tasks without loss of balance. Discussed awareness of signs/symptoms of TIA/stroke, importance of safe mobility to prevent falls, and remaining active. Pt reports he has no symptoms today that were present yesterday. Very eager to return home. No acute PT needs identified. PT is signing-off. Thank you for this referral.  Berton MountBarbour, Amariah Kierstead S 10/08/2014, 8:57 AM Charlsie MerlesLogan Secor Keliah Harned, PT [phone]

## 2014-10-08 NOTE — Progress Notes (Signed)
Called vascular to see what is holding the result for patient's 2 D echo.

## 2014-10-08 NOTE — Progress Notes (Signed)
Received a call back from vascular and was told there are several results that have not been read today. Was given a number to call the MD. Writer paged and is awaiting cal back.

## 2014-10-08 NOTE — Progress Notes (Signed)
VASCULAR LAB PRELIMINARY  PRELIMINARY  PRELIMINARY  PRELIMINARY  Carotid Dopplers completed.    Preliminary report:  1-39% ICA stenosis.  Vertebral artery flow is antegrade.   Ariely Riddell, RVT 10/08/2014, 11:11 AM

## 2014-10-08 NOTE — Progress Notes (Signed)
Patient is being d/c home. D/c instruction given to patient regarding driving restictios,but  patient stated "I respect the doctor, but he cannot tell me not to drive." Patient's condition stable.

## 2014-10-08 NOTE — Discharge Summary (Signed)
Wyatt [name], is a 81 y.o. male  DOB 1929/03/18  MRN [id number].  Admission date:  10/07/2014  Admitting Physician  Catarina Hartshorn, MD  Discharge Date:  10/08/2014   Primary MD  Michiel Sites, MD  Recommendations for primary care physician for things to follow:  - Check CBC, BMP during next visit - Please follow on the results of 2-D echo done on 5/15 , and hemoglobin A1c  Admission Diagnosis  Transient cerebral ischemia, unspecified transient cerebral ischemia type [G45.9]   Discharge Diagnosis  Transient cerebral ischemia, unspecified transient cerebral ischemia type [G45.9]    Active Problems:   PAF (paroxysmal atrial fibrillation)   Dysphasia   Hyponatremia   TIA (transient ischemic attack)      Past Medical History  Diagnosis Date  . Hypertension   . Hyperlipemia   . Atrial fibrillation   . Personal history of colonic polyps 04/2009    SERRATED ADENOMA  . Cardiac arrhythmia   . Diverticulosis   . Anxiety   . Cataract   . Lymphocytic colitis   . CAD (coronary artery disease)   . Inferior MI 2001    stent to RCA-Dr. Juanda Chance  . CVA (cerebral infarction)     while in [place]  . Syncope and collapse 2014    in [place] prior to the stroke by 24 hours.    Past Surgical History  Procedure Laterality Date  . Coronary angioplasty with stent placement  2001    Dr. Eden Emms  . Total knee arthroplasty         History of present illness and  Hospital Course:     Kindly see H&P for history of present illness and admission details, please review complete Labs, Consult reports and Test reports for all details in brief  HPI  from the history and physical done on the day of admission on 5/14 by Dr. Onalee Hua Tat 81 year old male with a history of paroxysmal atrial fibrillation, stroke, coronary artery disease with a history of stent to RCA, hyperlipidemia presented with expressive aphasia on the  morning of admission. The patient has been in his usual state of health until the morning of 10/07/2014. The patient was standing on his fence talking to his neighbor when he suddenly developed word finding difficulties. He stated that the episode lasted about 10-15 minutes after which he was back to normal. The patient denied any dizziness, visual disturbance, focal extremity weakness, syncope, chest discomfort, shortness of breath, nausea, vomiting, diaphoresis. He did have a bitemporal headache at this time which has improved but not completely resolved. Upon presentation to the emergency department, the patient was back to his baseline. The patient has not started any new medications. In the emergency department, the patient's vital signs were stable. BMP showed sodium 131, hepatic enzymes were unremarkable. CBC was unremarkable. Urinalysis was negative for pyuria. Point-of-care troponin was negative. EKG showed sinus rhythm with unchanged right bundle branch block. CT of the brain was negative for acute findings.  Hospital Course   Expressive aphasia/episode of  confusion -MRI/MRA brain does not show any evidence of acute CVA,discussed with the neurologist Dr. Pearlean Brownie, at this point seizure disorder could not be ruled out, so patient will be started on Depakote 500 mg twice a day, will be followed as an outpatient with neurology. -Carotid duplex 1-39% ICA stenosis. Vertebral artery flow is antegrade -Echocardiogram done, patient follow-up with results as an outpatient - LDL is 101, goal is less than 70, resumed on Lipitor   Paroxysmal atrial fibrillation  -Presently in sinus rhythm  -Rate controlled  -Continue apixiban  -Continue metoprolol tartrate    Hyponatremia  -Sodium is 134 at day of discharge-Resume home dose of NaCl tabs   Hyperlipidemia  -Resumed on Lipitor   Coronary artery disease with history of RCA stent  -No chest pain presently  -Continue apixiban       Discharge Condition:  stable   Follow UP  Follow-up Information    Follow up with Michiel Sites, MD. Schedule an appointment as soon as possible for a visit in 1 week.   Specialty:  Endocrinology   Contact information:   [address] [place] [address] [phone]       Schedule an appointment as soon as possible for a visit with Delia Heady, MD.   Specialties:  Neurology, Radiology   Why:  Posthospitalization follow-up   Contact information:   [address] [place] [address] [phone]         Discharge Instructions  and  Discharge Medications         Discharge Instructions    Diet - low sodium heart healthy    Complete by:  As directed      Discharge instructions    Complete by:  As directed   Follow with Primary MD Michiel Sites, MD in 7 days   Get CBC, CMP, 2 view Chest X ray checked  by Primary MD next visit.    Activity: As tolerated with Full fall precautions use walker/cane & assistance as needed   Disposition Home    Diet: Heart Healthy ** , with feeding assistance and aspiration precautions.  For Heart failure patients - Check your Weight same time everyday, if you gain over 2 pounds, or you develop in leg swelling, experience more shortness of breath or chest pain, call your Primary MD immediately. Follow Cardiac Low Salt Diet and 1.5 lit/day fluid restriction.   On your next visit with your primary care physician please Get Medicines reviewed and adjusted.   Please request your Prim.MD to go over all Hospital Tests and Procedure/Radiological results at the follow up, please get all Hospital records sent to your Prim MD by signing hospital release before you go home.   If you experience worsening of your admission symptoms, develop shortness of breath, life threatening emergency, suicidal or homicidal thoughts you must seek medical attention immediately by calling 911 or calling your MD  immediately  if symptoms less severe.  You Must read complete instructions/literature along with all the possible adverse reactions/side effects for all the Medicines you take and that have been prescribed to you. Take any new Medicines after you have completely understood and accpet all the possible adverse reactions/side effects.   Do not drive, operating heavy machinery, perform activities at heights, swimming or participation in water activities or provide baby sitting services if your were admitted for syncope or siezures until you have seen by Primary MD or a Neurologist and advised to do so again. These instructions were  acknowledged by daughter  Do not drive when taking Pain medications.    Do not take more than prescribed Pain, Sleep and Anxiety Medications  Special Instructions: If you have smoked or chewed Tobacco  in the last 2 yrs please stop smoking, stop any regular Alcohol  and or any Recreational drug use.  Wear Seat belts while in a car.   Please note  You were cared for by a hospitalist during your hospital stay. If you have any questions about your discharge medications or the care you received while you were in the hospital after you are discharged, you can call the unit and asked to speak with the hospitalist on call if the hospitalist that took care of you is not available. Once you are discharged, your primary care physician will handle any further medical issues. Please note that NO REFILLS for any discharge medications will be authorized once you are discharged, as it is imperative that you return to your primary care physician (or establish a relationship with a primary care physician if you do not have one) for your aftercare needs so that they can reassess your need for medications and monitor your lab values.     Driving Restrictions    Complete by:  As directed   Swelling, or operating heavy machinery, telemetry you are cleared by neurology     Increase activity  slowly    Complete by:  As directed             Medication List    TAKE these medications        atorvastatin 20 MG tablet  Commonly known as:  LIPITOR  Take 1 tablet (20 mg total) by mouth daily at 6 PM.     CEREFOLIN 10-24-48-5 MG Tabs  Take 1 tablet by mouth 1 day or 1 dose.     divalproex 125 MG capsule  Commonly known as:  DEPAKOTE SPRINKLE  Take 4 capsules (500 mg total) by mouth every 12 (twelve) hours.     ELIQUIS 5 MG Tabs tablet  Generic drug:  apixaban  TAKE 1 TABLET BY MOUTH TWICE DAILY TAKE 1 TABLET BY MOUTH TWICE DAILY     lisinopril 10 MG tablet  Commonly known as:  PRINIVIL,ZESTRIL  TAKE 1 TABLET BY MOUTH DAILY     LORazepam 0.5 MG tablet  Commonly known as:  ATIVAN  Take 0.5 mg by mouth at bedtime.     metoprolol tartrate 25 MG tablet  Commonly known as:  LOPRESSOR  Take 1 tablet (25 mg total) by mouth 2 (two) times daily.     multivitamin tablet  Take 1 tablet by mouth daily.     sodium chloride 1 G tablet  Take 1 g by mouth 2 (two) times daily.          Diet and Activity recommendation: See Discharge Instructions above   Consults obtained -  neurology   Major procedures and Radiology Reports - PLEASE review detailed and final reports for all details, in brief -     Ct Head Wo Contrast  10/07/2014   CLINICAL DATA:  Slurred speech 1.5 hours ago, now resolved. History of prior CVA.  EXAM: CT HEAD WITHOUT CONTRAST  TECHNIQUE: Contiguous axial images were obtained from the base of the skull through the vertex without intravenous contrast.  COMPARISON:  07/20/2012; 07/12/2012  FINDINGS: Similar findings of advanced atrophy with sulcal prominence and prominence of the bifrontal extra-axial spaces. Re- demonstrated extensive periventricular hypodensities compatible with microvascular  ischemic disease. Old lacunar infarct within the left basal ganglia (image 15, series 2). Unchanged size and configuration of the ventricles and basilar cisterns. No  midline shift. Intracranial atherosclerosis. A tiny (approximately 4 mm) osteoid osteoma is noted within the right frontal sinus, unchanged. Otherwise, normal appearance of the paranasal sinuses and mastoid air cells. No air-fluid levels. Debris is noted within the left external auditory canal. Regional soft tissues appear normal. Post bilateral cataract surgery. No displaced calvarial fracture.  IMPRESSION: Similar findings of atrophy and microvascular ischemic disease without acute intracranial process.   Electronically Signed   By: Simonne ComeJohn  Watts M.D.   On: 10/07/2014 12:48   Mr Brain Wo Contrast  10/07/2014   CLINICAL DATA:  Slurred speech several hours  ago, now resolved.  EXAM: MRI HEAD WITHOUT CONTRAST  MRA HEAD WITHOUT CONTRAST  TECHNIQUE: Multiplanar, multiecho pulse sequences of the brain and surrounding structures were obtained without intravenous contrast. Angiographic images of the head were obtained using MRA technique without contrast.  COMPARISON:  CT head earlier today.  FINDINGS: MRI HEAD FINDINGS  The patient was unable to remain motionless for the exam. Small or subtle lesions could be overlooked.  No evidence for acute infarction, hemorrhage, mass lesion, hydrocephalus, or extra-axial fluid. Advanced cerebral and cerebellar atrophy. Chronic RIGHT cerebellar and LEFT basal ganglia lacunar infarcts. No chronic hemorrhage is evident. Normal pituitary and cerebellar tonsils. BILATERAL cataract extraction. Extracranial soft tissues unremarkable.  MRA HEAD FINDINGS  Gross patency of the internal carotid arteries is established. Basilar artery similarly is widely patent with vertebrals codominant. No intracranial stenosis of the MCA or PCA vessels. Diminutive LEFT A1 ACA may be due to tortuosity, with dominant RIGHT A1 ACA and widely patent as are distal anterior cerebrals. No intracranial aneurysm.  IMPRESSION: No acute stroke is evident.  Chronic changes as described.  No intracranial flow reducing  lesion or occlusion.   Electronically Signed   By: Davonna BellingJohn  Curnes M.D.   On: 10/07/2014 20:38   Mr Maxine GlennMra Head/brain Wo Cm  10/07/2014   CLINICAL DATA:  Slurred speech several hours  ago, now resolved.  EXAM: MRI HEAD WITHOUT CONTRAST  MRA HEAD WITHOUT CONTRAST  TECHNIQUE: Multiplanar, multiecho pulse sequences of the brain and surrounding structures were obtained without intravenous contrast. Angiographic images of the head were obtained using MRA technique without contrast.  COMPARISON:  CT head earlier today.  FINDINGS: MRI HEAD FINDINGS  The patient was unable to remain motionless for the exam. Small or subtle lesions could be overlooked.  No evidence for acute infarction, hemorrhage, mass lesion, hydrocephalus, or extra-axial fluid. Advanced cerebral and cerebellar atrophy. Chronic RIGHT cerebellar and LEFT basal ganglia lacunar infarcts. No chronic hemorrhage is evident. Normal pituitary and cerebellar tonsils. BILATERAL cataract extraction. Extracranial soft tissues unremarkable.  MRA HEAD FINDINGS  Gross patency of the internal carotid arteries is established. Basilar artery similarly is widely patent with vertebrals codominant. No intracranial stenosis of the MCA or PCA vessels. Diminutive LEFT A1 ACA may be due to tortuosity, with dominant RIGHT A1 ACA and widely patent as are distal anterior cerebrals. No intracranial aneurysm.  IMPRESSION: No acute stroke is evident.  Chronic changes as described.  No intracranial flow reducing lesion or occlusion.   Electronically Signed   By: Davonna BellingJohn  Curnes M.D.   On: 10/07/2014 20:38    Micro Results     No results found for this or any previous visit (from the past 240 hour(s)).     Today   Subjective:  Wyatt [name] today has no headache,no chest abdominal pain,no new weakness tingling or numbness, feels much better wants to go home today.   Objective:   Blood pressure 120/64, pulse 79, temperature 98 F (36.7 C), temperature source Oral, resp.  rate 18, height  (1.727 m), weight 75.07 kg (165 lb 8 oz), SpO2 96 %.   Intake/Output Summary (Last 24 hours) at 10/08/14 1517 Last data filed at 10/08/14 1300  Gross per 24 hour  Intake    335 ml  Output      0 ml  Net    335 ml    Exam Awake Alert, Oriented x 3, No new F.N deficits, Normal affect [place].AT,PERRAL Supple Neck,No JVD, No cervical lymphadenopathy appriciated.  Symmetrical Chest wall movement, Good air movement bilaterally, CTAB RRR,No Gallops,Rubs or new Murmurs, No Parasternal Heave +ve B.Sounds, Abd Soft, Non tender, No organomegaly appriciated, No rebound -guarding or rigidity. No Cyanosis, Clubbing or edema, No new Rash or bruise  Data Review   CBC w Diff:  Lab Results  Component Value Date   WBC 7.5 10/07/2014   HGB 14.6 10/07/2014   HCT 41.4 10/07/2014   PLT 156 10/07/2014   LYMPHOPCT 24 10/07/2014   MONOPCT 6 10/07/2014   EOSPCT 2 10/07/2014   BASOPCT 0 10/07/2014    CMP:  Lab Results  Component Value Date   NA 134* 10/08/2014   K 4.0 10/08/2014   CL 97* 10/08/2014   CO2 27 10/08/2014   BUN 10 10/08/2014   CREATININE 0.92 10/08/2014   PROT 6.3* 10/07/2014   ALBUMIN 4.0 10/07/2014   BILITOT 1.1 10/07/2014   ALKPHOS 51 10/07/2014   AST 25 10/07/2014   ALT 20 10/07/2014  .   Total Time in preparing paper work, data evaluation and todays exam - 35 minutes  Blakely Gluth M.D on 10/08/2014 at 3:17 PM  [facility]   Office  [phone]

## 2014-10-08 NOTE — Discharge Instructions (Signed)
Follow with Primary MD Michiel SitesKOHUT,WALTER DENNIS, MD in 7 days   Get CBC, CMP, 2 view Chest X ray checked  by Primary MD next visit.    Activity: As tolerated with Full fall precautions use walker/cane & assistance as needed   Disposition Home    Diet: Heart Healthy  , with feeding assistance and aspiration precautions.  For Heart failure patients - Check your Weight same time everyday, if you gain over 2 pounds, or you develop in leg swelling, experience more shortness of breath or chest pain, call your Primary MD immediately. Follow Cardiac Low Salt Diet and 1.5 lit/day fluid restriction.   On your next visit with your primary care physician please Get Medicines reviewed and adjusted.   Please request your Prim.MD to go over all Hospital Tests and Procedure/Radiological results at the follow up, please get all Hospital records sent to your Prim MD by signing hospital release before you go home.   If you experience worsening of your admission symptoms, develop shortness of breath, life threatening emergency, suicidal or homicidal thoughts you must seek medical attention immediately by calling 911 or calling your MD immediately  if symptoms less severe.  You Must read complete instructions/literature along with all the possible adverse reactions/side effects for all the Medicines you take and that have been prescribed to you. Take any new Medicines after you have completely understood and accpet all the possible adverse reactions/side effects.   Do not drive, operating heavy machinery, perform activities at heights, swimming or participation in water activities or provide baby sitting services if your were admitted for syncope or siezures until you have seen by Primary MD or a Neurologist and advised to do so again. Discussed with daughter, she understood these instructions.  Do not drive when taking Pain medications.    Do not take more than prescribed Pain, Sleep and Anxiety  Medications  Special Instructions: If you have smoked or chewed Tobacco  in the last 2 yrs please stop smoking, stop any regular Alcohol  and or any Recreational drug use.  Wear Seat belts while driving.   Please note  You were cared for by a hospitalist during your hospital stay. If you have any questions about your discharge medications or the care you received while you were in the hospital after you are discharged, you can call the unit and asked to speak with the hospitalist on call if the hospitalist that took care of you is not available. Once you are discharged, your primary care physician will handle any further medical issues. Please note that NO REFILLS for any discharge medications will be authorized once you are discharged, as it is imperative that you return to your primary care physician (or establish a relationship with a primary care physician if you do not have one) for your aftercare needs so that they can reassess your need for medications and monitor your lab values.

## 2014-10-08 NOTE — Progress Notes (Signed)
OT Cancellation Note  Patient Details Name: Wyatt [name] MRN: [id number] DOB: 06/04/1928   Cancelled Treatment:    Reason Eval/Treat Not Completed: OT screened, no needs identified, will sign off - spoke with pt's son who reports pt fully back to baseline.    Angelene GiovanniConarpe, Teigan Sahli M  Alaycia Eardley Penningtononarpe, OTR/L [phone]  10/08/2014, 1:01 PM

## 2014-10-08 NOTE — Progress Notes (Signed)
STROKE TEAM PROGRESS NOTE   HISTORY Wyatt Wyatt [name] is a 81 y.o. male with a history of previous stroke and TIA who presents with transient episode of inability to get his words out. He states that he was talking to a neighbor over the fence and then began having difficulty with speaking. He did not have any trouble with understanding what other people or saying during this time. He did not notice any other symptoms.  Lkw: 12 pm  TPA given: No, resolved symptoms   SUBJECTIVE (INTERVAL HISTORY) The patient's wife is at the bedside. She reports that the patient has had 4 episodes of speech difficulty since yesterday. He is back to baseline today.   OBJECTIVE Temp:  [97.9 F (36.6 C)-98.6 F (37 C)] 98 F (36.7 C) (05/15 1420) Pulse Rate:  [60-83] 79 (05/15 1420) Cardiac Rhythm:  [-] Normal sinus rhythm (05/14 2200) Resp:  [16-18] 18 (05/15 1420) BP: (120-154)/(56-89) 120/64 mmHg (05/15 1420) SpO2:  [96 %-100 %] 96 % (05/15 1420) Weight:  [75.07 kg (165 lb 8 oz)] 75.07 kg (165 lb 8 oz) (05/14 1755)  No results for input(s): GLUCAP in the last 168 hours.  Recent Labs Lab 10/07/14 1223 10/08/14 0736  NA 131* 134*  K 4.2 4.0  CL 99* 97*  CO2 24 27  GLUCOSE 116* 119*  BUN 13 10  CREATININE 0.90 0.92  CALCIUM 9.1 9.6    Recent Labs Lab 10/07/14 1223  AST 25  ALT 20  ALKPHOS 51  BILITOT 1.1  PROT 6.3*  ALBUMIN 4.0    Recent Labs Lab 10/07/14 1223  WBC 7.5  NEUTROABS 5.1  HGB 14.6  HCT 41.4  MCV 92.4  PLT 156   No results for input(s): CKTOTAL, CKMB, CKMBINDEX, TROPONINI in the last 168 hours.  Recent Labs  10/07/14 1223  LABPROT 14.8  INR 1.15    Recent Labs  10/07/14 1344  COLORURINE YELLOW  LABSPEC 1.008  PHURINE 6.5  GLUCOSEU NEGATIVE  HGBUR NEGATIVE  BILIRUBINUR NEGATIVE  KETONESUR NEGATIVE  PROTEINUR NEGATIVE  UROBILINOGEN 0.2  NITRITE NEGATIVE  LEUKOCYTESUR NEGATIVE       Component Value Date/Time   CHOL 182 10/08/2014 0736   TRIG  114 10/08/2014 0736   HDL 58 10/08/2014 0736   CHOLHDL 3.1 10/08/2014 0736   VLDL 23 10/08/2014 0736   LDLCALC 101* 10/08/2014 0736   No results found for: HGBA1C    Component Value Date/Time   LABOPIA NONE DETECTED 10/07/2014 1344   COCAINSCRNUR NONE DETECTED 10/07/2014 1344   LABBENZ NONE DETECTED 10/07/2014 1344   AMPHETMU NONE DETECTED 10/07/2014 1344   THCU NONE DETECTED 10/07/2014 1344   LABBARB NONE DETECTED 10/07/2014 1344     Recent Labs Lab 10/07/14 1223  ETH <5    Ct Head Wo Contrast Similar findings of atrophy and microvascular ischemic disease without acute intracranial process.      Mr Brain Wo Contrast 10/07/2014    No acute stroke is evident.  Chronic changes as described.  No intracranial flow reducing lesion or occlusion.      PHYSICAL EXAM Pleasant elderly Caucasian male not in distress. . Afebrile. Head is nontraumatic. Neck is supple without bruit.    Cardiac exam no murmur or gallop. Lungs are clear to auscultation. Distal pulses are well felt. Neurological Exam ;  Awake  Alert oriented x 3.Diminished recall. Normal speech and language.eye movements full without nystagmus.fundi were not visualized. Vision acuity and fields appear normal. Hearing is normal. Palatal movements  are normal. Face symmetric. Tongue midline. Normal strength, tone, reflexes and coordination. Normal sensation. Gait deferred.     ASSESSMENT/PLAN Mr. Wyatt Wyatt [name] is a 81 y.o. male with history of a previous stroke, TIAs, hypertension, syncope, hyperlipidemia, atrial fibrillation, and coronary artery disease presenting with speech difficulties. He did not receive IV t-PA due to resolution of deficits.  TIA:    Resultant  resolution of deficits  MRI as above  MRA  as above  Carotid Doppler  pending  2D Echo  mild aortic stenosis. No cardiac source of emboli identified.  LDL 101  HgbA1c pending  Eliquis for VTE prophylaxis Diet regular Room service appropriate?:  Yes; Fluid consistency:: Thin Diet - low sodium heart healthy  eliquis (apixaban) prior to admission, now on eliquis (apixaban)  Ongoing aggressive stroke risk factor management  Therapy recommendations:  No needs identified  Disposition:  Discharge today  Hypertension  Home meds:   Toprol and lisinopril  Stable   Hyperlipidemia  Home meds:  Atorvastatin not resumed in hospital  LDL 101, goal < 70  Increase atorvastatin  Continue statin at discharge    Other Stroke Risk Factors  Advanced age  Hx stroke/TIA  Coronary artery disease   Other Active Problems    PLAN  Start Depakote ER 500 mg Wyatt [name] for possible seizures as etiology of patient's recurrent deficits.  Avoid dehydration.  Okay to discharge.  Follow-up Dr. Kathaleen GrinderSethi  Hospital day # 1  Delton Seeavid Rinehuls PA-C [facility] Pager [phone] 10/08/2014, 5:15 PM I have personally examined this patient, reviewed notes, independently viewed imaging studies, participated in medical decision making and plan of care. I have made any additions or clarifications directly to the above note. Agree with note above.he presented with  Transient speech difficulties which have been recurrent and possibilities include TIA versus simple partial seizure. He remains at risk for neurological worsening, recurrent stroke/TIA and needs ongoing evaluation. Given extensive prioe w/u for similar episode with start Depakote ER 500 mg for seizure prophylaxis. Long d/w patient, wife and Dr Mliss Fritzawood and answered questions. Keep outpt appointment with me.  Delia HeadyPramod Sethi, MD Medical Director [facility] Pager: [phone] 10/09/2014 7:51 AM    To contact Stroke Continuity provider, please refer to [email or web address]. After hours, contact General Neurology

## 2014-10-09 LAB — HEMOGLOBIN A1C
Hgb A1c MFr Bld: 5.5 % (ref 4.8–5.6)
MEAN PLASMA GLUCOSE: 111 mg/dL

## 2014-10-17 DIAGNOSIS — E789 Disorder of lipoprotein metabolism, unspecified: Secondary | ICD-10-CM | POA: Diagnosis not present

## 2014-10-17 DIAGNOSIS — I10 Essential (primary) hypertension: Secondary | ICD-10-CM | POA: Diagnosis not present

## 2014-10-17 DIAGNOSIS — I4891 Unspecified atrial fibrillation: Secondary | ICD-10-CM | POA: Diagnosis not present

## 2014-10-17 DIAGNOSIS — I639 Cerebral infarction, unspecified: Secondary | ICD-10-CM | POA: Diagnosis not present

## 2014-10-24 ENCOUNTER — Ambulatory Visit (INDEPENDENT_AMBULATORY_CARE_PROVIDER_SITE_OTHER): Payer: Medicare Other | Admitting: Nurse Practitioner

## 2014-10-24 ENCOUNTER — Encounter: Payer: Self-pay | Admitting: Nurse Practitioner

## 2014-10-24 VITALS — BP 149/75 | HR 66 | Ht 68.0 in | Wt 169.2 lb

## 2014-10-24 DIAGNOSIS — G3184 Mild cognitive impairment, so stated: Secondary | ICD-10-CM

## 2014-10-24 DIAGNOSIS — Z8673 Personal history of transient ischemic attack (TIA), and cerebral infarction without residual deficits: Secondary | ICD-10-CM | POA: Diagnosis not present

## 2014-10-24 DIAGNOSIS — R4789 Other speech disturbances: Secondary | ICD-10-CM | POA: Diagnosis not present

## 2014-10-24 DIAGNOSIS — I10 Essential (primary) hypertension: Secondary | ICD-10-CM | POA: Diagnosis not present

## 2014-10-24 DIAGNOSIS — I639 Cerebral infarction, unspecified: Secondary | ICD-10-CM | POA: Diagnosis not present

## 2014-10-24 NOTE — Patient Instructions (Signed)
Will get EEG Continue Depakote 250 twice daily Continue Apixiban for  atrial fib and secondary stroke prevention Strict control of hypertension with blood pressure 130/90 or below Follow-up in 6 months

## 2014-10-24 NOTE — Progress Notes (Addendum)
[facility]  PATIENT: Wyatt [name] DOB: [birth year]   REASON FOR VISIT: Follow-up for history of TIAs stroke / mild cognitive impairment , episodes of speech arrest HISTORY FROM: Patient and wife    HISTORY OF PRESENT ILLNESS:  Update 03/23/2014 ; He returns for followup last visit 7 months ago. He continues to do well from a stroke standpoint without definite stroke or TIA symptoms. The patient's wife however feels he may have had a TIA 2 weeks ago but the description is not highly suggestive. Patient stated he felt funny and nauseous and something was going on but was not very descriptive. He laid down for a few minutes and when he got up he felt okay. He did not have any headache speech balance difficulties of weakness. He continues to have intermittent temporal pain which is infrequent. He rarely needs to take that always seems to work well. He continues to have mild short-term memory difficulties which not progressive and unchanged. He is physically quite active and continues to play golf on a daily basis and he feels this has helped his walking. He does get a few minor bruises on his request but has not had any significant bleeding episodes. He states his blood pressure is well controlled. He has no new complaints. Last visit 05/16/2013 : He returns for followup today after loss as a 03/01/13. He seemed today urgently as a revisit upon request from his wife. He has had at least 3 episodes of transient speech disturbance as well as confusion lasting few minutes since her last visit. The patient tries to speak words are not clear he can barely be understood and has more trouble expressing himself. He during one of these episodes the wife noticed that he could not tell the time clearly on his watch and was little confused. He has also been complaining of dizziness which describes as room spinning which occurs only when he bends his head down and the feeling is transient and  disappears when he looks up. This has been occurring intermittently but does not occur every time. He denies any ear pain, ringing in the ears or decreased hearing. He has had previous history of transient vertigo on 3-4 occasions over the last 20 years. He has never been evaluated or prescribe any vestibular stabilization exercises. He has been started on meclizine recently by his primary physician and feels that may be helping him. He states his memory difficulties are unchanged and he has had long-standing minor memory loss for several years which does not appear to be getting any worse. Initial Consult 03/01/13 Wyatt [name] is a 81 year old Caucasian male who had episode of sudden onset of dizziness, gait imbalance and walking like a drunk on 12/22/12 while admitted in the hospital in [place] for workup for a possible syncopal episode. He woke up from sleep and denied any vertigo or headache. He's noticed that he could not walk properly and his balance was off he has trouble coordinating his left side. CT head was unremarkable. He had an MRI scan of the brain done at [facility] which I personally reviewed showed changes of small vessel disease but no acute infarct. MRA of the brain showed no significant large vessel and stenosis.He was seen by neurologist Dr Caroline More who felt he had a small brainstem or cerebellar infarct not visible on MRI due to small vessel disease. He has gotten his extensive medical records about his hospitalization and test results which I have personally reviewed. Carotid  ultrasound showed no significant extraction stenosis. Transthoracic echo showed normal ejection fraction with a cardiac source of embolism. He was on baby aspirin which was continued. Vascular status also identified including hypertension hyperlipidemia and is continued on his medications. He was transferred for inpatient rehabilitation to [facility] in [place] and was discharged a month ago. He is currently  undergoing physical therapy as an outpatient and has not been steady improvement in and is now able to walk independently without any assistance. He has long standing history of mild short-term memory difficulties which have been unchanged over several years. He had a fall in January when he hit the back of his head without losing consciousness. Since then he's been having intermittent headaches which he describes as bitemporal sharp shooting and transient. He was seen by a neurologist in [place] for this and underwent temporal artery biopsy which was negative. Headaches still continue but are getting better and last only 30 seconds. He takes Tylenol which seems to help. He has no known prior history of strokes or TIAs. He states his blood pressures are under good control usually in the 120s at home though it is slightly elevated at 159/102 in office today. Update 09/22/2014 : He returns for follow-up after last visit 6 months ago. Is accompanied by his son eater. Patient and son both feel that they've noticed some mild progression of her short-term memory difficulties. He still is quite independent in activities of daily living. He lives at home in fact looks after his wife was and neck and shoulder problems. He has been persistently working on his golf game and is proud to state that he is making good progress. He is still quite active in the yard and working at home. He is tolerating eliquis with minor bruising but no major bleeding episodes. His blood pressure control has been good. He denies any recurrent stroke or TIA symptoms. UPDATE 10/24/2014 Wyatt [name], 81 year old male returns for follow-up. He was last seen in this office 4/29/ 2016 and had overnight admission to the hospital 10/08/2014 for transient episode of inability to get his words out  that lasted 10-15 minutes and then he was back to normal. MRI of the brain without evidence of acute CVA carotid duplex 1-39% ICA stenosis. Echocardiogram  unchanged. He was placed on Depakote by Dr. Pearlean Brownie and is currently taking 250 twice daily. He has not had further episodes of speech arrest. He did not get EEG in the hospital. He remains on Eliquis  for atrial fibrillation and secondary stroke prevention. He returns for reevaluation. He is going to [place] in 2 weeks for 6 months. REVIEW OF SYSTEMS: Full 14 system review of systems performed and notable only for those listed, all others are neg:  Constitutional: neg  Cardiovascular: neg Ear/Nose/Throat: neg  Skin: neg Eyes: neg Respiratory: neg Gastroitestinal: neg  Hematology/Lymphatic: neg  Endocrine: neg Musculoskeletal:neg Allergy/Immunology: neg Neurological: neg Psychiatric: neg Sleep : neg   ALLERGIES: Allergies  Allergen Reactions  . Ampicillin Other (See Comments)    REACTION: unspecified  . Metronidazole Other (See Comments)    Burning skin and hands    HOME MEDICATIONS: Outpatient Prescriptions Prior to Visit  Medication Sig Dispense Refill  . atorvastatin (LIPITOR) 20 MG tablet Take 1 tablet (20 mg total) by mouth daily at 6 PM. 30 tablet 0  . divalproex (DEPAKOTE SPRINKLE) 125 MG capsule Take 4 capsules (500 mg total) by mouth every 12 (twelve) hours. (Patient taking differently: Take 250 mg by mouth 2 (two)  times daily. ) 120 capsule 0  . ELIQUIS 5 MG TABS tablet TAKE 1 TABLET BY MOUTH TWICE DAILY TAKE 1 TABLET BY MOUTH TWICE DAILY (Patient taking differently: TAKE 1 TABLET BY MOUTH TWICE DAILY) 180 tablet 0  . L-Methylfolate-B12-B6-B2 (CEREFOLIN) 10-24-48-5 MG TABS Take 1 tablet by mouth 1 day or 1 dose. 90 each 1  . lisinopril (PRINIVIL,ZESTRIL) 10 MG tablet TAKE 1 TABLET BY MOUTH DAILY 90 tablet 3  . LORazepam (ATIVAN) 0.5 MG tablet Take 0.5 mg by mouth at bedtime.     . metoprolol tartrate (LOPRESSOR) 25 MG tablet Take 1 tablet (25 mg total) by mouth 2 (two) times daily. 180 tablet 3  . sodium chloride 1 G tablet Take 1 g by mouth 2 (two) times daily.    .  Multiple Vitamin (MULTIVITAMIN) tablet Take 1 tablet by mouth daily.       No facility-administered medications prior to visit.    PAST MEDICAL HISTORY: Past Medical History  Diagnosis Date  . Hypertension   . Hyperlipemia   . Atrial fibrillation   . Personal history of colonic polyps 04/2009    SERRATED ADENOMA  . Cardiac arrhythmia   . Diverticulosis   . Anxiety   . Cataract   . Lymphocytic colitis   . CAD (coronary artery disease)   . Inferior MI 2001    stent to RCA-Dr. Juanda ChanceBrodie  . CVA (cerebral infarction)     while in [place]  . Syncope and collapse 2014    in [place] prior to the stroke by 24 hours.    PAST SURGICAL HISTORY: Past Surgical History  Procedure Laterality Date  . Coronary angioplasty with stent placement  2001    Dr. Eden EmmsNishan  . Total knee arthroplasty      FAMILY HISTORY: Family History  Problem Relation Age of Onset  . Colon cancer Neg Hx   . Liver cancer Father   . Cancer Sister   . Cancer Sister     SOCIAL HISTORY: History   Social History  . Marital Status: Married    Spouse Name: N/A  . Number of Children: 5  . Years of Education: college   Occupational History  . Retired    Social History Main Topics  . Smoking status: Never Smoker   . Smokeless tobacco: Never Used  . Alcohol Use: No  . Drug Use: No  . Sexual Activity: Not on file   Other Topics Concern  . Not on file   Social History Narrative   Patient is married with 5 children.   Patient is right handed.   Patient has college education.   Patient drinks 3 cups daily.     PHYSICAL EXAM  Filed Vitals:   10/24/14 1315  BP: 149/75  Pulse: 66  Height: 5\' 8"  (1.727 m)  Weight: 169 lb 3.2 oz (76.749 kg)   Body mass index is 25.73 kg/(m^2). General: well developed, well nourished elderly Caucasian, seated, in no evident distress Head: head normocephalic and atraumatic. Orohparynx benign Neck: supple with no carotid or supraclavicular bruits Cardiovascular: no  murmurs or rubs Musculoskeletal: no deformity. Mild kyphoscoliosis Skin: no rash/petichiae Vascular: Normal pulses all extremities  Neurological examination  Mental Status: Awake and fully alert. Oriented to place and time. Recent and remote memory intact. Attention span, concentration and fund of knowledge appropriate. Mood and affect appropriate. Mini-Mental status exam 25/30 ( last visit 20/30 ) with deficits in orientation, attention and recall only. Animal fluency test 14. Clock drawing 3/4.  Cranial Nerves: Fundoscopic exam not done. Pupils equal, briskly reactive to light. Extraocular movements full without nystagmus. Visual fields full to confrontation. Hearing diminished on the right. Facial sensation intact. Face, tongue, palate moves normally and symmetrically.  Motor: Normal bulk and tone. Normal strength in all tested extremity muscles. Sensory.: intact to touch and pinprick and vibratory sensation.  Coordination: Rapid alternating movements normal in all extremities. Finger-to-nose and heel-to-shin performed accurately bilaterally.  Gait and Station: Arises from chair without difficulty. Stance is normal. Gait demonstrates normal stride length and balance . Able  to heel, toe and tandem walk without difficulty. No assistive device. Reflexes: 1+ and symmetric except ankle jerks are depressed. Toes downgoing.   DIAGNOSTIC DATA (LABS, IMAGING, TESTING) - I reviewed patient records, labs, notes, testing and imaging myself where available.  Lab Results  Component Value Date   WBC 7.5 10/07/2014   HGB 14.6 10/07/2014   HCT 41.4 10/07/2014   MCV 92.4 10/07/2014   PLT 156 10/07/2014      Component Value Date/Time   NA 134* 10/08/2014 0736   K 4.0 10/08/2014 0736   CL 97* 10/08/2014 0736   CO2 27 10/08/2014 0736   GLUCOSE 119* 10/08/2014 0736   BUN 10 10/08/2014 0736   CREATININE 0.92 10/08/2014 0736   CALCIUM 9.6 10/08/2014 0736   PROT 6.3* 10/07/2014 1223    ALBUMIN 4.0 10/07/2014 1223   AST 25 10/07/2014 1223   ALT 20 10/07/2014 1223   ALKPHOS 51 10/07/2014 1223   BILITOT 1.1 10/07/2014 1223   GFRNONAA >60 10/08/2014 0736   GFRAA >60 10/08/2014 0736   Lab Results  Component Value Date   CHOL 182 10/08/2014   HDL 58 10/08/2014   LDLCALC 101* 10/08/2014   TRIG 114 10/08/2014   CHOLHDL 3.1 10/08/2014   Lab Results  Component Value Date   HGBA1C 5.5 10/08/2014   Lab Results  Component Value Date   VITAMINB12 644 10/08/2014   Lab Results  Component Value Date   TSH 0.788 10/08/2014      ASSESSMENT AND PLAN  81 y.o. year old male  has a past medical history of Hypertension; Hyperlipemia; Atrial fibrillation;  (04/2009); Cardiac arrhythmia;  CAD (coronary artery disease); Inferior MI (2001); CVA (cerebral infarction); and Syncope and collapse (2014). and episodes of speech arrest here to follow-up. Recent admission overnight for questionable TIA 10/08/2014 .MRI of the brain without evidence of acute CVA, carotid duplex 1-39% ICA stenosis. Echocardiogram unchanged. He was placed on Depakote by Dr. Pearlean Brownie and is currently taking 250 twice daily. He has not had further episodes of speech arrest. He did not get EEG in the hospital. The patient is a current patient of Dr. Pearlean Brownie who is out of the office today . This note is sent to the work in doctor.     Will get EEG Continue Depakote 250 twice daily Continue Apixiban for  atrial fib and secondary stroke prevention Strict control of hypertension with blood pressure 130/90 or below Follow-up in 6 months, patient is leaving  in 2 weeks to go to [place] for 6 months Nilda Riggs, [facility], [facility], APRN  [facility] [address] [place], [place] [address] [phone]  Personally examined images, and agree with documented history, physical, neuro exam,assessment and plan as stated above.   Naomie Dean, MD Neurology [phone] [facility]

## 2014-10-30 ENCOUNTER — Ambulatory Visit (INDEPENDENT_AMBULATORY_CARE_PROVIDER_SITE_OTHER): Payer: Medicare Other | Admitting: Neurology

## 2014-10-30 DIAGNOSIS — Z8673 Personal history of transient ischemic attack (TIA), and cerebral infarction without residual deficits: Secondary | ICD-10-CM | POA: Diagnosis not present

## 2014-10-30 DIAGNOSIS — R4789 Other speech disturbances: Secondary | ICD-10-CM

## 2014-10-31 ENCOUNTER — Telehealth: Payer: Self-pay | Admitting: *Deleted

## 2014-10-31 NOTE — Telephone Encounter (Signed)
Per Darrol Angelarolyn Martin, NP EEG results called to pt.  Requested copy mailed to pt.  Done.

## 2014-11-08 DIAGNOSIS — N400 Enlarged prostate without lower urinary tract symptoms: Secondary | ICD-10-CM | POA: Diagnosis not present

## 2014-11-23 ENCOUNTER — Other Ambulatory Visit: Payer: Self-pay | Admitting: Internal Medicine

## 2014-12-25 ENCOUNTER — Other Ambulatory Visit: Payer: Self-pay | Admitting: Internal Medicine

## 2014-12-25 NOTE — Telephone Encounter (Signed)
REFILL 

## 2015-02-02 DIAGNOSIS — R152 Fecal urgency: Secondary | ICD-10-CM | POA: Diagnosis not present

## 2015-02-05 DIAGNOSIS — R152 Fecal urgency: Secondary | ICD-10-CM | POA: Diagnosis not present

## 2015-02-07 DIAGNOSIS — R197 Diarrhea, unspecified: Secondary | ICD-10-CM | POA: Diagnosis not present

## 2015-02-07 DIAGNOSIS — R152 Fecal urgency: Secondary | ICD-10-CM | POA: Diagnosis not present

## 2015-02-12 DIAGNOSIS — H43813 Vitreous degeneration, bilateral: Secondary | ICD-10-CM | POA: Diagnosis not present

## 2015-02-12 DIAGNOSIS — H40013 Open angle with borderline findings, low risk, bilateral: Secondary | ICD-10-CM | POA: Diagnosis not present

## 2015-02-12 DIAGNOSIS — H35373 Puckering of macula, bilateral: Secondary | ICD-10-CM | POA: Diagnosis not present

## 2015-02-23 DIAGNOSIS — R42 Dizziness and giddiness: Secondary | ICD-10-CM | POA: Diagnosis not present

## 2015-02-23 DIAGNOSIS — R29818 Other symptoms and signs involving the nervous system: Secondary | ICD-10-CM | POA: Diagnosis not present

## 2015-02-23 DIAGNOSIS — S51011A Laceration without foreign body of right elbow, initial encounter: Secondary | ICD-10-CM | POA: Diagnosis not present

## 2015-02-23 DIAGNOSIS — R4182 Altered mental status, unspecified: Secondary | ICD-10-CM | POA: Diagnosis not present

## 2015-02-26 DIAGNOSIS — H35373 Puckering of macula, bilateral: Secondary | ICD-10-CM | POA: Diagnosis not present

## 2015-02-26 DIAGNOSIS — I471 Supraventricular tachycardia: Secondary | ICD-10-CM | POA: Diagnosis not present

## 2015-02-26 DIAGNOSIS — I4510 Unspecified right bundle-branch block: Secondary | ICD-10-CM | POA: Diagnosis not present

## 2015-02-26 DIAGNOSIS — H40013 Open angle with borderline findings, low risk, bilateral: Secondary | ICD-10-CM | POA: Diagnosis not present

## 2015-02-26 DIAGNOSIS — H43813 Vitreous degeneration, bilateral: Secondary | ICD-10-CM | POA: Diagnosis not present

## 2015-02-27 DIAGNOSIS — L089 Local infection of the skin and subcutaneous tissue, unspecified: Secondary | ICD-10-CM | POA: Diagnosis not present

## 2015-02-27 DIAGNOSIS — S51811A Laceration without foreign body of right forearm, initial encounter: Secondary | ICD-10-CM | POA: Diagnosis not present

## 2015-03-07 DIAGNOSIS — S40811A Abrasion of right upper arm, initial encounter: Secondary | ICD-10-CM | POA: Diagnosis not present

## 2015-03-07 DIAGNOSIS — Z23 Encounter for immunization: Secondary | ICD-10-CM | POA: Diagnosis not present

## 2015-03-07 DIAGNOSIS — I10 Essential (primary) hypertension: Secondary | ICD-10-CM | POA: Diagnosis not present

## 2015-03-27 ENCOUNTER — Other Ambulatory Visit: Payer: Self-pay

## 2015-03-27 DIAGNOSIS — G3184 Mild cognitive impairment, so stated: Secondary | ICD-10-CM

## 2015-03-27 MED ORDER — CEREFOLIN 6-1-50-5 MG PO TABS: 1.0000 | ORAL_TABLET | Freq: Every day | ORAL | Status: DC

## 2015-04-06 ENCOUNTER — Ambulatory Visit: Payer: Medicare Other | Admitting: Neurology

## 2015-05-08 ENCOUNTER — Other Ambulatory Visit: Payer: Self-pay

## 2015-05-08 ENCOUNTER — Telehealth: Payer: Self-pay | Admitting: Neurology

## 2015-05-08 DIAGNOSIS — G3184 Mild cognitive impairment, so stated: Secondary | ICD-10-CM

## 2015-05-08 MED ORDER — CEREFOLIN 6-1-50-5 MG PO TABS: 1.0000 | ORAL_TABLET | Freq: Every day | ORAL | Status: DC

## 2015-05-08 NOTE — Telephone Encounter (Signed)
Prescription refill for patient until next appt in 07/2015.

## 2015-05-08 NOTE — Telephone Encounter (Signed)
[facility], [address], [place] order L-methylfolate B12-B-6,B-2 5 mg tablets for patient.  Thanks!

## 2015-05-14 DIAGNOSIS — N401 Enlarged prostate with lower urinary tract symptoms: Secondary | ICD-10-CM | POA: Diagnosis not present

## 2015-05-14 DIAGNOSIS — N138 Other obstructive and reflux uropathy: Secondary | ICD-10-CM | POA: Diagnosis not present

## 2015-05-14 DIAGNOSIS — N5201 Erectile dysfunction due to arterial insufficiency: Secondary | ICD-10-CM | POA: Diagnosis not present

## 2015-05-22 ENCOUNTER — Other Ambulatory Visit: Payer: Self-pay | Admitting: Internal Medicine

## 2015-05-22 NOTE — Telephone Encounter (Signed)
Rx request sent to pharmacy.  

## 2015-05-29 ENCOUNTER — Other Ambulatory Visit: Payer: Self-pay | Admitting: Internal Medicine

## 2015-05-29 NOTE — Telephone Encounter (Signed)
Rx request sent to pharmacy.  

## 2015-07-26 ENCOUNTER — Ambulatory Visit (INDEPENDENT_AMBULATORY_CARE_PROVIDER_SITE_OTHER): Payer: Medicare Other | Admitting: Neurology

## 2015-07-26 ENCOUNTER — Encounter: Payer: Self-pay | Admitting: Neurology

## 2015-07-26 VITALS — BP 151/79 | HR 75 | Ht 68.0 in | Wt 171.4 lb

## 2015-07-26 DIAGNOSIS — G459 Transient cerebral ischemic attack, unspecified: Secondary | ICD-10-CM | POA: Diagnosis not present

## 2015-07-26 DIAGNOSIS — G3184 Mild cognitive impairment, so stated: Secondary | ICD-10-CM

## 2015-07-26 NOTE — Patient Instructions (Signed)
I had a long discussion with the patient and his son regarding his remote TIAs as well as mild cognitive impairment both of which appear to be stable. Continue eliquis for second stroke prevention with strict control of hypertension with blood pressure goal below 130/90 and lipids with LDL cholesterol goal below 70 mg percent. Continue her fall and for cognitive impairment as well as increased participation in cognitively challenging activities like playing crossword puzzles, sudoku and bridge. Check follow-up carotid ultrasound study and lipid profile. Return for follow-up in 6 months with nurse practitioner or call earlier if necessary

## 2015-07-26 NOTE — Progress Notes (Signed)
[facility]  PATIENT: Wyatt [name] DOB: 1929/01/02   REASON FOR VISIT: Follow-up for history of TIAs stroke / mild cognitive impairment , episodes of speech arrest HISTORY FROM: Patient and wife    HISTORY OF PRESENT ILLNESS:  Update 03/23/2014 ; He returns for followup last visit 7 months ago. He continues to do well from a stroke standpoint without definite stroke or TIA symptoms. The patient's wife however feels he may have had a TIA 2 weeks ago but the description is not highly suggestive. Patient stated he felt funny and nauseous and something was going on but was not very descriptive. He laid down for a few minutes and when he got up he felt okay. He did not have any headache speech balance difficulties of weakness. He continues to have intermittent temporal pain which is infrequent. He rarely needs to take that always seems to work well. He continues to have mild short-term memory difficulties which not progressive and unchanged. He is physically quite active and continues to play golf on a daily basis and he feels this has helped his walking. He does get a few minor bruises on his request but has not had any significant bleeding episodes. He states his blood pressure is well controlled. He has no new complaints. Last visit 05/16/2013 : He returns for followup today after loss as a 03/01/13. He seemed today urgently as a revisit upon request from his wife. He has had at least 3 episodes of transient speech disturbance as well as confusion lasting few minutes since her last visit. The patient tries to speak words are not clear he can barely be understood and has more trouble expressing himself. He during one of these episodes the wife noticed that he could not tell the time clearly on his watch and was little confused. He has also been complaining of dizziness which describes as room spinning which occurs only when he bends his head down and the feeling is transient and  disappears when he looks up. This has been occurring intermittently but does not occur every time. He denies any ear pain, ringing in the ears or decreased hearing. He has had previous history of transient vertigo on 3-4 occasions over the last 20 years. He has never been evaluated or prescribe any vestibular stabilization exercises. He has been started on meclizine recently by his primary physician and feels that may be helping him. He states his memory difficulties are unchanged and he has had long-standing minor memory loss for several years which does not appear to be getting any worse. Initial Consult 03/01/13 Wyatt [name] is a 82 year old Caucasian male who had episode of sudden onset of dizziness, gait imbalance and walking like a drunk on 12/22/12 while admitted in the hospital in [place] for workup for a possible syncopal episode. He woke up from sleep and denied any vertigo or headache. He's noticed that he could not walk properly and his balance was off he has trouble coordinating his left side. CT head was unremarkable. He had an MRI scan of the brain done at [facility] which I personally reviewed showed changes of small vessel disease but no acute infarct. MRA of the brain showed no significant large vessel and stenosis.He was seen by neurologist Dr Caroline More who felt he had a small brainstem or cerebellar infarct not visible on MRI due to small vessel disease. He has gotten his extensive medical records about his hospitalization and test results which I have personally reviewed. Carotid  ultrasound showed no significant extraction stenosis. Transthoracic echo showed normal ejection fraction with a cardiac source of embolism. He was on baby aspirin which was continued. Vascular status also identified including hypertension hyperlipidemia and is continued on his medications. He was transferred for inpatient rehabilitation to [facility] in [facility] and was discharged a month ago. He is currently  undergoing physical therapy as an outpatient and has not been steady improvement in and is now able to walk independently without any assistance. He has long standing history of mild short-term memory difficulties which have been unchanged over several years. He had a fall in January when he hit the back of his head without losing consciousness. Since then he's been having intermittent headaches which he describes as bitemporal sharp shooting and transient. He was seen by a neurologist in [place] for this and underwent temporal artery biopsy which was negative. Headaches still continue but are getting better and last only 30 seconds. He takes Tylenol which seems to help. He has no known prior history of strokes or TIAs. He states his blood pressures are under good control usually in the 120s at home though it is slightly elevated at 159/102 in office today. Update 09/22/2014 : He returns for follow-up after last visit 6 months ago. Is accompanied by his son eater. Patient and son both feel that they've noticed some mild progression of her short-term memory difficulties. He still is quite independent in activities of daily living. He lives at home in fact looks after his wife was and neck and shoulder problems. He has been persistently working on his golf game and is proud to state that he is making good progress. He is still quite active in the yard and working at home. He is tolerating eliquis with minor bruising but no major bleeding episodes. His blood pressure control has been good. He denies any recurrent stroke or TIA symptoms. UPDATE 10/24/2014 Wyatt [name], 82 year old male returns for follow-up. He was last seen in this office 4/29/ 2016 and had overnight admission to the hospital 10/08/2014 for transient episode of inability to get his words out  that lasted 10-15 minutes and then he was back to normal. MRI of the brain without evidence of acute CVA carotid duplex 1-39% ICA stenosis. Echocardiogram  unchanged. He was placed on Depakote by Dr. Pearlean Brownie and is currently taking 250 twice daily. He has not had further episodes of speech arrest. He did not get EEG in the hospital. He remains on Eliquis  for atrial fibrillation and secondary stroke prevention. He returns for reevaluation. He is going to [place] in 2 weeks for 6 months. Update 07/26/2015 : He returns for follow-up after last visit 10 months ago. He is accompanied by his son. Patient remains stable from neurovascular as well as cognitive standpoint. He is had no further stroke or TIA symptoms. He is tolerating eliquis well with only minor bruising but no bleeding episodes. The patient discontinued Depakote which had started for episodes of speech arrest possible simple partial seizures because of dizziness and drowsiness but has not had any further episodes despite stopping Depakote. He remains onset of:Marland Kitchen His Mini-Mental status exam testing score today was 25/30 which is stable from last visit. He remains on Cerefolin and Reservetarol He is on sure when he had his last lipid profile checked and he is not had follow-up carotid Dopplers checked in a while. REVIEW OF SYSTEMS: Full 14 system review of systems performed and notable only for those listed, all others are neg:  Frequency of urination, memory loss, joint pain  ALLERGIES: Allergies  Allergen Reactions  . Ampicillin Other (See Comments)    REACTION: unspecified  . Metronidazole Other (See Comments)    Burning skin and hands    HOME MEDICATIONS: Outpatient Prescriptions Prior to Visit  Medication Sig Dispense Refill  . atorvastatin (LIPITOR) 20 MG tablet Take 1 tablet (20 mg total) by mouth daily at 6 PM. 30 tablet 0  . ELIQUIS 5 MG TABS tablet TAKE 1 TABLET BY MOUTH TWICE DAILY 180 tablet 0  . L-Methylfolate-B12-B6-B2 (CEREFOLIN) 10-24-48-5 MG TABS Take 1 tablet by mouth daily. 30 each 3  . lisinopril (PRINIVIL,ZESTRIL) 10 MG tablet TAKE 1 TABLET BY MOUTH DAILY 90 tablet 1  . LORazepam  (ATIVAN) 0.5 MG tablet Take 0.5 mg by mouth at bedtime.     . metoprolol tartrate (LOPRESSOR) 25 MG tablet TAKE 1 TABLET BY MOUTH TWICE DAILY 180 tablet 3  . RESVERATROL PO Take 200 mg by mouth daily.     . sodium chloride 1 G tablet Take 1 g by mouth 2 (two) times daily.    . divalproex (DEPAKOTE SPRINKLE) 125 MG capsule Take 4 capsules (500 mg total) by mouth every 12 (twelve) hours. (Patient not taking: Reported on 07/26/2015) 120 capsule 0   No facility-administered medications prior to visit.    PAST MEDICAL HISTORY: Past Medical History  Diagnosis Date  . Hypertension   . Hyperlipemia   . Atrial fibrillation (HCC)   . Personal history of colonic polyps 04/2009    SERRATED ADENOMA  . Cardiac arrhythmia   . Diverticulosis   . Anxiety   . Lymphocytic colitis   . CAD (coronary artery disease)   . Inferior MI (HCC) 2001    stent to RCA-Dr. Juanda Chance  . CVA (cerebral infarction)     while in [place]  . Syncope and collapse 2014    in [place] prior to the stroke by 24 hours.  . Stroke [facility])     PAST SURGICAL HISTORY: Past Surgical History  Procedure Laterality Date  . Coronary angioplasty with stent placement  2001    Dr. Eden Emms  . Total knee arthroplasty      FAMILY HISTORY: Family History  Problem Relation Age of Onset  . Colon cancer Neg Hx   . Liver cancer Father   . Cancer Sister   . Cancer Sister     SOCIAL HISTORY: Social History   Social History  . Marital Status: Married    Spouse Name: N/A  . Number of Children: 5  . Years of Education: college   Occupational History  . Retired    Social History Main Topics  . Smoking status: Never Smoker   . Smokeless tobacco: Never Used  . Alcohol Use: No  . Drug Use: No  . Sexual Activity: Not on file   Other Topics Concern  . Not on file   Social History Narrative   Patient is married with 5 children.   Patient is right handed.   Patient has college education.   Patient drinks 3 cups daily.      PHYSICAL EXAM  Filed Vitals:   07/26/15 1532  BP: 151/79  Pulse: 75  Height: 5\' 8"  (1.727 m)  Weight: 171 lb 6.4 oz (77.747 kg)   Body mass index is 26.07 kg/(m^2). General: well developed, well nourished elderly Caucasian, seated, in no evident distress Head: head normocephalic and atraumatic.  Neck: supple with no carotid or supraclavicular bruits Cardiovascular: no murmurs  or rubs Musculoskeletal: no deformity. Mild kyphoscoliosis Skin: no rash/petichiae Vascular: Normal pulses all extremities  Neurological examination  Mental Status: Awake and fully alert. Oriented to place and time. Recent and remote memory intact. Attention span, concentration and fund of knowledge appropriate. Mood and affect appropriate. Mini-Mental status exam 25/30 ( last visit 25/30 ) with deficits in orientation, attention and recall only. Animal fluency test 11. Clock drawing 1/4.  Cranial Nerves: Fundoscopic exam not done. Pupils equal, briskly reactive to light. Extraocular movements full without nystagmus. Visual fields full to confrontation. Hearing diminished on the right. Facial sensation intact. Face, tongue, palate moves normally and symmetrically.  Motor: Normal bulk and tone. Normal strength in all tested extremity muscles. Sensory.: intact to touch and pinprick and vibratory sensation.  Coordination: Rapid alternating movements normal in all extremities. Finger-to-nose and heel-to-shin performed accurately bilaterally.  Gait and Station: Arises from chair without difficulty. Stance is normal. Gait demonstrates normal stride length and balance . Able  to heel, toe and tandem walk without difficulty. No assistive device. Reflexes: 1+ and symmetric except ankle jerks are depressed. Toes downgoing.   DIAGNOSTIC DATA (LABS, IMAGING, TESTING) - I reviewed patient records, labs, notes, testing and imaging myself where available.  Lab Results  Component Value Date   WBC 7.5 10/07/2014    HGB 14.6 10/07/2014   HCT 41.4 10/07/2014   MCV 92.4 10/07/2014   PLT 156 10/07/2014      Component Value Date/Time   NA 134* 10/08/2014 0736   K 4.0 10/08/2014 0736   CL 97* 10/08/2014 0736   CO2 27 10/08/2014 0736   GLUCOSE 119* 10/08/2014 0736   BUN 10 10/08/2014 0736   CREATININE 0.92 10/08/2014 0736   CALCIUM 9.6 10/08/2014 0736   PROT 6.3* 10/07/2014 1223   ALBUMIN 4.0 10/07/2014 1223   AST 25 10/07/2014 1223   ALT 20 10/07/2014 1223   ALKPHOS 51 10/07/2014 1223   BILITOT 1.1 10/07/2014 1223   GFRNONAA >60 10/08/2014 0736   GFRAA >60 10/08/2014 0736   Lab Results  Component Value Date   CHOL 182 10/08/2014   HDL 58 10/08/2014   LDLCALC 101* 10/08/2014   TRIG 114 10/08/2014   CHOLHDL 3.1 10/08/2014   Lab Results  Component Value Date   HGBA1C 5.5 10/08/2014   Lab Results  Component Value Date   VITAMINB12 644 10/08/2014   Lab Results  Component Value Date   TSH 0.788 10/08/2014      ASSESSMENT AND PLAN  82 y.o. year old male  has a past medical history of Hypertension; Hyperlipemia; Atrial fibrillation;  (04/2009); Cardiac arrhythmia;  CAD (coronary artery disease); Inferior MI (2001); CVA (cerebral infarction); and Syncope and collapse (2014). and episodes of speech arrest here to follow-up. Recent admission overnight for questionable TIA 10/08/2014  Mild cognitive impairment which is stable  PLAN : I had a long discussion with the patient and his son regarding his remote TIAs as well as mild cognitive impairment both of which appear to be stable. Continue eliquis for second stroke prevention with strict control of hypertension with blood pressure goal below 130/90 and lipids with LDL cholesterol goal below 70 mg percent. Continue her fall and for cognitive impairment as well as increased participation in cognitively challenging activities like playing crossword puzzles, sudoku and bridge. Check follow-up carotid ultrasound study and lipid profile. Return  for follow-up in 6 months with nurse practitioner or call earlier if necessary Delia Heady, MD  [facility]

## 2015-08-02 ENCOUNTER — Ambulatory Visit (INDEPENDENT_AMBULATORY_CARE_PROVIDER_SITE_OTHER): Payer: Medicare Other

## 2015-08-02 ENCOUNTER — Other Ambulatory Visit (INDEPENDENT_AMBULATORY_CARE_PROVIDER_SITE_OTHER): Payer: Self-pay

## 2015-08-02 ENCOUNTER — Other Ambulatory Visit: Payer: Medicare Other

## 2015-08-02 DIAGNOSIS — G3184 Mild cognitive impairment, so stated: Secondary | ICD-10-CM

## 2015-08-02 DIAGNOSIS — Z0289 Encounter for other administrative examinations: Secondary | ICD-10-CM

## 2015-08-02 DIAGNOSIS — G459 Transient cerebral ischemic attack, unspecified: Secondary | ICD-10-CM | POA: Diagnosis not present

## 2015-08-03 LAB — LIPID PANEL
CHOL/HDL RATIO: 2.3 ratio (ref 0.0–5.0)
Cholesterol, Total: 151 mg/dL (ref 100–199)
HDL: 66 mg/dL (ref >39)
LDL Calculated: 72 mg/dL (ref 0–99)
TRIGLYCERIDES: 66 mg/dL (ref 0–149)
VLDL Cholesterol Cal: 13 mg/dL (ref 5–40)

## 2015-08-21 DIAGNOSIS — E789 Disorder of lipoprotein metabolism, unspecified: Secondary | ICD-10-CM | POA: Diagnosis not present

## 2015-08-21 DIAGNOSIS — I2510 Atherosclerotic heart disease of native coronary artery without angina pectoris: Secondary | ICD-10-CM | POA: Diagnosis not present

## 2015-08-21 DIAGNOSIS — I639 Cerebral infarction, unspecified: Secondary | ICD-10-CM | POA: Diagnosis not present

## 2015-08-21 DIAGNOSIS — I10 Essential (primary) hypertension: Secondary | ICD-10-CM | POA: Diagnosis not present

## 2015-08-22 DIAGNOSIS — N390 Urinary tract infection, site not specified: Secondary | ICD-10-CM | POA: Diagnosis not present

## 2015-09-02 ENCOUNTER — Other Ambulatory Visit: Payer: Self-pay | Admitting: Internal Medicine

## 2015-09-04 ENCOUNTER — Other Ambulatory Visit: Payer: Self-pay | Admitting: Neurology

## 2015-09-12 ENCOUNTER — Ambulatory Visit: Payer: Medicare Other | Admitting: Internal Medicine

## 2015-11-29 ENCOUNTER — Other Ambulatory Visit: Payer: Self-pay | Admitting: Internal Medicine

## 2015-11-29 NOTE — Telephone Encounter (Signed)
Rx(s) sent to pharmacy electronically.  

## 2015-12-04 ENCOUNTER — Other Ambulatory Visit: Payer: Self-pay | Admitting: Neurology

## 2015-12-04 ENCOUNTER — Other Ambulatory Visit: Payer: Self-pay

## 2015-12-28 ENCOUNTER — Other Ambulatory Visit: Payer: Self-pay | Admitting: Internal Medicine

## 2015-12-28 NOTE — Telephone Encounter (Signed)
Rx(s) sent to pharmacy electronically.  

## 2015-12-31 ENCOUNTER — Other Ambulatory Visit: Payer: Self-pay | Admitting: Internal Medicine

## 2016-02-04 ENCOUNTER — Other Ambulatory Visit: Payer: Self-pay | Admitting: Internal Medicine

## 2016-02-21 DIAGNOSIS — Z23 Encounter for immunization: Secondary | ICD-10-CM | POA: Diagnosis not present

## 2016-03-02 ENCOUNTER — Other Ambulatory Visit: Payer: Self-pay | Admitting: Internal Medicine

## 2016-03-04 DIAGNOSIS — H40013 Open angle with borderline findings, low risk, bilateral: Secondary | ICD-10-CM | POA: Diagnosis not present

## 2016-03-10 ENCOUNTER — Other Ambulatory Visit: Payer: Self-pay | Admitting: Internal Medicine

## 2016-03-10 NOTE — Telephone Encounter (Signed)
REFILL 

## 2016-03-31 DIAGNOSIS — I4891 Unspecified atrial fibrillation: Secondary | ICD-10-CM | POA: Diagnosis not present

## 2016-03-31 DIAGNOSIS — I4430 Unspecified atrioventricular block: Secondary | ICD-10-CM | POA: Diagnosis not present

## 2016-03-31 DIAGNOSIS — I10 Essential (primary) hypertension: Secondary | ICD-10-CM | POA: Diagnosis not present

## 2016-03-31 DIAGNOSIS — R42 Dizziness and giddiness: Secondary | ICD-10-CM | POA: Diagnosis not present

## 2016-03-31 DIAGNOSIS — E789 Disorder of lipoprotein metabolism, unspecified: Secondary | ICD-10-CM | POA: Diagnosis not present

## 2016-04-01 ENCOUNTER — Encounter: Payer: Self-pay | Admitting: Physician Assistant

## 2016-04-07 ENCOUNTER — Ambulatory Visit (INDEPENDENT_AMBULATORY_CARE_PROVIDER_SITE_OTHER): Payer: Medicare Other | Admitting: Physician Assistant

## 2016-04-07 ENCOUNTER — Encounter: Payer: Self-pay | Admitting: Physician Assistant

## 2016-04-07 VITALS — BP 120/72 | HR 61 | Ht 68.0 in | Wt 170.4 lb

## 2016-04-07 DIAGNOSIS — E871 Hypo-osmolality and hyponatremia: Secondary | ICD-10-CM | POA: Diagnosis not present

## 2016-04-07 DIAGNOSIS — R42 Dizziness and giddiness: Secondary | ICD-10-CM

## 2016-04-07 DIAGNOSIS — I350 Nonrheumatic aortic (valve) stenosis: Secondary | ICD-10-CM | POA: Diagnosis not present

## 2016-04-07 MED ORDER — METOPROLOL TARTRATE 25 MG PO TABS: 25.0000 mg | ORAL_TABLET | Freq: Two times a day (BID) | ORAL | 3 refills | Status: DC

## 2016-04-07 MED ORDER — LISINOPRIL 10 MG PO TABS: 10.0000 mg | ORAL_TABLET | Freq: Every day | ORAL | 3 refills | Status: DC

## 2016-04-07 NOTE — Progress Notes (Signed)
Cardiology Office Note    Date:  04/07/2016   ID:  Wyatt [name], DOB July 28, 1928, MRN [id number]  PCP:  Wyatt Sites, MD  Cardiologist:  Wyatt [name] 08/2014 Wyatt [name], Wyatt [name]   Chief Complaint  Patient presents with  . Dizziness    History of Present Illness: Wyatt [name] is a 82 y.o. male with a history of TIA/CVA, PAF and atrial flutter on Eliquis (CHA2DS2VASc=7 for age x 2, CVA x 2, HTN, DM, CAD), MI w/ stent RCA 2001, HTN, DM, HLD, mild AS, NSVT on monitor, orthostatic syncope, hyponatremia, RBBB.  11/06 [facility] referred for dizziness and heart murmur  Wyatt [name] presents for evaluation of dizziness. He is here with his wife, married since 1956.   He has had some episodes of dizziness, a more severe one the other day was associated with BP 168/64 and HR 66. He had been up for a while, had eaten breakfast and was going to the bathroom but it was not urgent. No presyncope or syncope. He has had some other episodes of dizziness, not orthostatic in nature.   He has short term memory loss and does not remember things well but is very active around the house. He exercises daily by hitting 150 or more golf balls. He rakes and does other things around the house without difficulty. No chest pain, no SOB.   While in [place], on a hot day when he was gardening, planting tomato plants he became dizzy. His wife feels he needs to drink more water. He drinks 2 cups of coffee and has cereal and milk in the morning, glass of milk at lunch, occasional beer or glass of wine at night. A little water otherwise, but not much.  No bleeding issues on the Eliquis. His wife makes sure he is compliant with his medications.    Past Medical History:  Diagnosis Date  . Anxiety   . Atrial fibrillation (HCC)   . CAD (coronary artery disease)   . CVA (cerebral infarction)    while in [place]  . Diverticulosis   . Hyperlipemia   . Hypertension   . Inferior MI (HCC) 2001   stent to RCA-Wyatt. Juanda [name]  . Lymphocytic colitis   . Personal history of colonic polyps 04/2009   SERRATED ADENOMA  . Stroke (HCC)   . Syncope and collapse 2014   in [place] prior to the stroke by 24 hours.    Past Surgical History:  Procedure Laterality Date  . CORONARY ANGIOPLASTY WITH STENT PLACEMENT  2001   Wyatt. Eden [name] [name], Wyatt Wyatt [name]  . TOTAL KNEE ARTHROPLASTY      Current Outpatient Prescriptions  Medication Sig Dispense Refill  . atorvastatin (LIPITOR) 10 MG tablet Take 1 tablet by mouth every morning.  2  . CIALIS 20 MG tablet Take 20 mg by mouth daily as needed.  0  . ELIQUIS 5 MG TABS tablet TAKE 1 TABLET BY MOUTH TWICE DAILY 180 tablet 1  . L-Methylfolate-B12-B6-B2 (CEREFOLIN) 10-24-48-5 MG TABS TAKE 1 TABLET BY MOUTH DAILY 30 each 6  . lisinopril (PRINIVIL,ZESTRIL) 10 MG tablet Take 1 tablet (10 mg total) by mouth daily. NEED OV. 90 tablet 3  . LORazepam (ATIVAN) 0.5 MG tablet Take 0.5 mg by mouth at bedtime.     . meclizine (ANTIVERT) 12.5 MG tablet   2  . metoprolol tartrate (LOPRESSOR) 25 MG tablet Take 1 tablet (25 mg total) by mouth 2 (two) times daily. PLEASE CONTACT OFFICE FOR ADDITIONAL  REFILLS 180 tablet 3  . mirabegron ER (MYRBETRIQ) 50 MG TB24 tablet Take 50 mg by mouth daily.    Marland Kitchen. RESVERATROL PO Take 200 mg by mouth daily.     . sodium chloride 1 G tablet Take 1 g by mouth 2 (two) times daily.    . tamsulosin (FLOMAX) 0.4 MG CAPS capsule Reported on 07/26/2015  3   No current facility-administered medications for this visit.     Allergies:   Ampicillin and Metronidazole    Social History:  The patient  reports that he has never smoked. He has never used smokeless tobacco. He reports that he does not drink alcohol or use drugs.   Family History:  The patient's family history includes Cancer in his sister and sister; Liver cancer in his father.    ROS:  Please see the history of present illness. All other systems are reviewed and negative.     PHYSICAL EXAM: VS:  BP 120/72   Pulse 61   Ht 5\' 8"  (1.727 m)   Wt 170 lb 6.4 oz (77.3 kg)   BMI 25.91 kg/m  , BMI Body mass index is 25.91 kg/m. GEN: Well nourished, well developed, male in no acute distress  HEENT: normal for age  Neck: no JVD, no carotid bruit, no masses Cardiac: RRR; 2/6 typical AS murmur, no rubs, or gallops Respiratory:  clear to auscultation bilaterally, normal work of breathing GI: soft, nontender, nondistended, + BS MS: no deformity or atrophy; no edema; distal pulses are 2+ in all 4 extremities   Skin: warm and dry, no rash Neuro:  Strength and sensation are intact Psych: euthymic mood, full affect   EKG:  EKG is ordered today. The ekg ordered today demonstrates SR, HR 61, RBBB is old.  ECHO: 09/2014 - Left ventricle: The cavity size was normal. Wall thickness was   increased in a pattern of moderate LVH. Systolic function was   normal. The estimated ejection fraction was in the range of 55%   to 60%. Wall motion was normal; there were no regional wall   motion abnormalities. Doppler parameters are consistent with   abnormal left ventricular relaxation (grade 1 diastolic dysfunction). - Aortic valve: There was very mild stenosis. Valve area (VTI):   1.46 cm^2. Valve area (Vmax): 1.18 cm^2. Valve area (Vmean): 1.27 cm^2. - Mitral valve: Calcified annulus. Mildly thickened leaflets .   Recent Labs: No results found for requested labs within last 8760 hours.    Lipid Panel    Component Value Date/Time   CHOL 151 08/02/2015 0928   TRIG 66 08/02/2015 0928   HDL 66 08/02/2015 0928   CHOLHDL 2.3 08/02/2015 0928   CHOLHDL 3.1 10/08/2014 0736   VLDL 23 10/08/2014 0736   LDLCALC 72 08/02/2015 0928     Wt Readings from Last 3 Encounters:  04/07/16 170 lb 6.4 oz (77.3 kg)  07/26/15 171 lb 6.4 oz (77.7 kg)  10/24/14 169 lb 3.2 oz (76.7 kg)     Other studies Reviewed: Additional studies/ records that were reviewed today include: office  notes and faxed records.  ASSESSMENT AND PLAN:  1.  Dizziness: feel there is a contribution of inadequate fluid intake. Advised pt and wife that he should drink another glass or 2 of water daily. BP was elevated the other day when pt symptomatic, but BP generally well-controlled at baseline. His wife is advised to watch for LE edema, but get him to drink more water.  2. AS: May be contributing  to symptoms. He has a classic AS murmur but S2 is audible to doubt severe. However, AS has likely progressed. Ck echo and f/u with Wyatt Wyatt GoldenHilty.   3. Memory issues: His wife manages many things to keep him healthy. His memory issues have progressed, by comparison of his current status to previous notes. Management per IM.  4. Hyponatremia: s/p recent check by Wyatt Juleen ChinaKohut and was 132. No sx, per Wyatt Juleen ChinaKohut   Current medicines are reviewed at length with the patient today.  The patient does not have concerns regarding medicines.  The following changes have been made:  no change  Labs/ tests ordered today include:   Orders Placed This Encounter  Procedures  . EKG 12-Lead  . ECHOCARDIOGRAM COMPLETE     Disposition:   FU with Wyatt Wyatt GoldenHilty  Signed, Wyatt [name], Wyatt Yott, Wyatt [name]  04/07/2016 10:42 AM    [facility] Phone: [phone](336) [phone]; Fax: [phone](336) [phone]  This note was written with the assistance of speech recognition software. Please excuse any transcriptional errors.

## 2016-04-07 NOTE — Patient Instructions (Signed)
Schedule Echocardiogram     Your physician recommends that you schedule a follow-up appointment after Echo with Dr.Hilty.

## 2016-04-09 ENCOUNTER — Ambulatory Visit (INDEPENDENT_AMBULATORY_CARE_PROVIDER_SITE_OTHER): Payer: Medicare Other | Admitting: Nurse Practitioner

## 2016-04-09 ENCOUNTER — Encounter: Payer: Self-pay | Admitting: Nurse Practitioner

## 2016-04-09 VITALS — BP 154/78 | HR 67 | Ht 68.0 in | Wt 168.0 lb

## 2016-04-09 DIAGNOSIS — G3184 Mild cognitive impairment, so stated: Secondary | ICD-10-CM

## 2016-04-09 DIAGNOSIS — G459 Transient cerebral ischemic attack, unspecified: Secondary | ICD-10-CM

## 2016-04-09 DIAGNOSIS — I10 Essential (primary) hypertension: Secondary | ICD-10-CM

## 2016-04-09 MED ORDER — CEREFOLIN 6-1-50-5 MG PO TABS: 1.0000 | ORAL_TABLET | Freq: Every day | ORAL | 6 refills | Status: DC

## 2016-04-09 NOTE — Patient Instructions (Signed)
Continue eliquis for second stroke prevention  strict control of hypertension with blood pressure goal below 130/90 continue blood pressure medications  lipids with LDL cholesterol goal below 70 mg percent. Continue Lipitor Continue participation in cognitively challenging activities like playing crossword puzzles, sudoku and bridge. Hitting golf  Balls Continue Cerefolin  will refill

## 2016-04-09 NOTE — Progress Notes (Signed)
[facility]  PATIENT: Wyatt [name] DOB: 10-20-1928   REASON FOR VISIT: Follow-up for mild cognitive impairment, history of TIA ,hyperlipidemia HISTORY FROM: Patient and wife    HISTORY OF PRESENT ILLNESS:Initial Consult 03/01/13 Wyatt [name] is a 82 year old Caucasian male who had episode of sudden onset of dizziness, gait imbalance and walking like a drunk on 12/22/12 while admitted in the hospital in [place] for workup for a possible syncopal episode. He woke up from sleep and denied any vertigo or headache. He's noticed that he could not walk properly and his balance was off he has trouble coordinating his left side. CT head was unremarkable. He had an MRI scan of the brain done at [facility] which I personally reviewed showed changes of small vessel disease but no acute infarct. MRA of the brain showed no significant large vessel and stenosis.He was seen by neurologist Dr Caroline More who felt he had a small brainstem or cerebellar infarct not visible on MRI due to small vessel disease. He has gotten his extensive medical records about his hospitalization and test results which I have personally reviewed. Carotid ultrasound showed no significant extraction stenosis. Transthoracic echo showed normal ejection fraction with a cardiac source of embolism. He was on baby aspirin which was continued. Vascular status also identified including hypertension hyperlipidemia and is continued on his medications. He was transferred for inpatient rehabilitation to [facility] in [facility] and was discharged a month ago. He is currently undergoing physical therapy as an outpatient and has not been steady improvement in and is now able to walk independently without any assistance. He has long standing history of mild short-term memory difficulties which have been unchanged over several years. He had a fall in January when he hit the back of his head without losing consciousness. Since then  he's been having intermittent headaches which he describes as bitemporal sharp shooting and transient. He was seen by a neurologist in [place] for this and underwent temporal artery biopsy which was negative. Headaches still continue but are getting better and last only 30 seconds. He takes Tylenol which seems to help. He has no known prior history of strokes or TIAs. He states his blood pressures are under good control usually in the 120s at home though it is slightly elevated at 159/102 in office today.  Update 10/29/2015PS ; He returns for followup last visit 7 months ago. He continues to do well from a stroke standpoint without definite stroke or TIA symptoms. The patient's wife however feels he may have had a TIA 2 weeks ago but the description is not highly suggestive. Patient stated he felt funny and nauseous and something was going on but was not very descriptive. He laid down for a few minutes and when he got up he felt okay. He did not have any headache speech balance difficulties of weakness. He continues to have intermittent temporal pain which is infrequent. He rarely needs to take that always seems to work well. He continues to have mild short-term memory difficulties which not progressive and unchanged. He is physically quite active and continues to play golf on a daily basis and he feels this has helped his walking. He does get a few minor bruises on his request but has not had any significant bleeding episodes. He states his blood pressure is well controlled. He has no new complaints.  Update 09/22/2014 PS: He returns for follow-up after last visit 6 months ago. Is accompanied by his son eater. Patient and  son both feel that they've noticed some mild progression of her short-term memory difficulties. He still is quite independent in activities of daily living. He lives at home in fact looks after his wife was and neck and shoulder problems. He has been persistently working on his golf game and is  proud to state that he is making good progress. He is still quite active in the yard and working at home. He is tolerating eliquis with minor bruising but no major bleeding episodes. His blood pressure control has been good. He denies any recurrent stroke or TIA symptoms. UPDATE 10/24/2014 PSMr. [name], 82 year old male returns for follow-up. He was last seen in this office 4/29/ 2016 and had overnight admission to the hospital 10/08/2014 for transient episode of inability to get his words out  that lasted 10-15 minutes and then he was back to normal. MRI of the brain without evidence of acute CVA carotid duplex 1-39% ICA stenosis. Echocardiogram unchanged. He was placed on Depakote by Dr. Pearlean Brownie and is currently taking 250 twice daily. He has not had further episodes of speech arrest. He did not get EEG in the hospital. He remains on Eliquis  for atrial fibrillation and secondary stroke prevention. He returns for reevaluation. He is going to [place] in 2 weeks for 6 months. Update 07/26/2015 PS: He returns for follow-up after last visit 10 months ago. He is accompanied by his son. Patient remains stable from neurovascular as well as cognitive standpoint. He is had no further stroke or TIA symptoms. He is tolerating eliquis well with only minor bruising but no bleeding episodes. The patient discontinued Depakote which had started for episodes of speech arrest possible simple partial seizures because of dizziness and drowsiness but has not had any further episodes despite stopping Depakote. He remains onset of:Marland Kitchen His Mini-Mental status exam testing score today was 25/30 which is stable from last visit. He remains on Cerefolin and Reservetarol He is on sure when he had his last lipid profile checked and he is not had follow-up carotid Dopplers checked in a while. UPDATE 11/15/2017[name], 82 year old male returns for follow-up. He has history of TIA in the past he is currently on Eliquis for secondary stroke  prevention and atrial fibrillation. He is also on Lipitor without muscle myalgias. Most recent lipid profile 08/03/2015 with LDL of 72. Most recent carotid Doppler 08/02/2015 was negative for significant stenosis involving extracranial carotid and vertebral arteries. He continues to be very active. He also has mild cognitive impairment and has been on Cerefolin for  several years. He returns for reevaluation.   REVIEW OF SYSTEMS: Full 14 system review of systems performed and notable only for those listed, all others are neg:  Constitutional: neg  Cardiovascular: neg Ear/Nose/Throat: neg  Skin: neg Eyes: neg Respiratory: neg Gastroitestinal: neg  Hematology/Lymphatic: neg  Endocrine: neg Musculoskeletal:neg Allergy/Immunology: neg Neurological: neg Psychiatric: neg Sleep : neg   ALLERGIES: Allergies  Allergen Reactions  . Ampicillin Other (See Comments)    REACTION: unspecified  . Metronidazole Other (See Comments)    Burning skin and hands    HOME MEDICATIONS: Outpatient Medications Prior to Visit  Medication Sig Dispense Refill  . atorvastatin (LIPITOR) 10 MG tablet Take 1 tablet by mouth every morning.  2  . CIALIS 20 MG tablet Take 20 mg by mouth daily as needed.  0  . ELIQUIS 5 MG TABS tablet TAKE 1 TABLET BY MOUTH TWICE DAILY 180 tablet 1  . L-Methylfolate-B12-B6-B2 (CEREFOLIN) 10-24-48-5 MG TABS TAKE  1 TABLET BY MOUTH DAILY 30 each 6  . lisinopril (PRINIVIL,ZESTRIL) 10 MG tablet Take 1 tablet (10 mg total) by mouth daily. NEED OV. 90 tablet 3  . LORazepam (ATIVAN) 0.5 MG tablet Take 0.5 mg by mouth at bedtime.     . meclizine (ANTIVERT) 12.5 MG tablet   2  . metoprolol tartrate (LOPRESSOR) 25 MG tablet Take 1 tablet (25 mg total) by mouth 2 (two) times daily. PLEASE CONTACT OFFICE FOR ADDITIONAL REFILLS 180 tablet 3  . mirabegron ER (MYRBETRIQ) 50 MG TB24 tablet Take 50 mg by mouth daily.    Marland Kitchen. RESVERATROL PO Take 200 mg by mouth daily.     . sodium chloride 1 G tablet  Take 1 g by mouth 2 (two) times daily.    . tamsulosin (FLOMAX) 0.4 MG CAPS capsule Reported on 07/26/2015  3   No facility-administered medications prior to visit.     PAST MEDICAL HISTORY: Past Medical History:  Diagnosis Date  . Anxiety   . Atrial fibrillation (HCC)   . CAD (coronary artery disease)   . CVA (cerebral infarction)    while in [place]  . Diverticulosis   . Hyperlipemia   . Hypertension   . Inferior MI (HCC) 2001   stent to RCA-Dr. Juanda ChanceBrodie  . Lymphocytic colitis   . Personal history of colonic polyps 04/2009   SERRATED ADENOMA  . Stroke (HCC)   . Syncope and collapse 2014   in [place] prior to the stroke by 24 hours.    PAST SURGICAL HISTORY: Past Surgical History:  Procedure Laterality Date  . CORONARY ANGIOPLASTY WITH STENT PLACEMENT  2001   Dr. Eden EmmsNishan cathed, Dr Juanda ChanceBrodie stented  . TOTAL KNEE ARTHROPLASTY      FAMILY HISTORY: Family History  Problem Relation Age of Onset  . Liver cancer Father   . Cancer Sister   . Cancer Sister   . Colon cancer Neg Hx     SOCIAL HISTORY: Social History   Social History  . Marital status: Married    Spouse name: N/A  . Number of children: 5  . Years of education: college   Occupational History  . Retired    Social History Main Topics  . Smoking status: Never Smoker  . Smokeless tobacco: Never Used  . Alcohol use No  . Drug use: No  . Sexual activity: Not on file   Other Topics Concern  . Not on file   Social History Narrative   Patient is married with 5 children.   Patient is right handed.   Patient has college education.   Patient drinks 3 cups daily.     PHYSICAL EXAM  Vitals:   04/09/16 1418  BP: (!) 154/78  Pulse: 67  Weight: 168 lb (76.2 kg)  Height: 5\' 8"  (1.727 m)   Body mass index is 25.54 kg/m. General: well developed, well nourished elderly Caucasian, seated, in no evident distress Head: head normocephalic and atraumatic.  Neck: supple with no carotid  bruits Cardiovascular:  no murmurs or rubs Musculoskeletal: no deformity. Mild kyphoscoliosis Skin: no rash/petichiae Vascular: Normal pulses all extremities  Neurological examination  Mental Status: Awake and fully alert.  Attention span, concentration and fund of knowledge appropriate. Mood and affect appropriate. Mini-Mental status exam 21/30 ( last visit 25/30 ) with deficits in orientation, attention and recall only. Animal fluency test 8.  Clock drawing 2/4.  Cranial Nerves: Fundoscopic exam not done. Pupils equal, briskly reactive to light. Extraocular movements full without nystagmus.  Visual fields full to confrontation. Hearing diminished on the right. Facial sensation intact. Face, tongue, palate moves normally and symmetrically.  Motor: Normal bulk and tone. Normal strength in all tested extremity muscles. Sensory.: intact to touch and pinprick and vibratory sensation.  Coordination: Rapid alternating movements normal in all extremities. Finger-to-nose and heel-to-shin performed accurately bilaterally.  Gait and Station: Arises from chair without difficulty. Stance is normal. Gait demonstrates normal stride length and balance . Able  to heel, toe and tandem walk without difficulty. No assistive device. Reflexes: 1+ and symmetric except ankle jerks are depressed. Toes downgoing.  DIAGNOSTIC DATA (LABS, IMAGING, TESTING) - I reviewed patient records, labs, notes, testing and imaging myself where available.  Lab Results  Component Value Date   WBC 7.5 10/07/2014   HGB 14.6 10/07/2014   HCT 41.4 10/07/2014   MCV 92.4 10/07/2014   PLT 156 10/07/2014      Component Value Date/Time   NA 134 (L) 10/08/2014 0736   K 4.0 10/08/2014 0736   CL 97 (L) 10/08/2014 0736   CO2 27 10/08/2014 0736   GLUCOSE 119 (H) 10/08/2014 0736   BUN 10 10/08/2014 0736   CREATININE 0.92 10/08/2014 0736   CALCIUM 9.6 10/08/2014 0736   PROT 6.3 (L) 10/07/2014 1223   ALBUMIN 4.0 10/07/2014 1223   AST 25 10/07/2014  1223   ALT 20 10/07/2014 1223   ALKPHOS 51 10/07/2014 1223   BILITOT 1.1 10/07/2014 1223   GFRNONAA >60 10/08/2014 0736   GFRAA >60 10/08/2014 0736   Lab Results  Component Value Date   CHOL 151 08/02/2015   HDL 66 08/02/2015   LDLCALC 72 08/02/2015   TRIG 66 08/02/2015   CHOLHDL 2.3 08/02/2015   Lab Results  Component Value Date   HGBA1C 5.5 10/08/2014   Lab Results  Component Value Date   VITAMINB12 644 10/08/2014   Lab Results  Component Value Date   TSH 0.788 10/08/2014      ASSESSMENT AND PLAN 82 y.o. year old male  has a past medical history of Hypertension; Hyperlipemia; Atrial fibrillation;  (04/2009); Cardiac arrhythmia;  CAD (coronary artery disease); Inferior MI (2001); CVA (cerebral infarction); and Syncope and collapse (2014). and episodes of speech arrest here to follow-up. Admission overnight for questionable TIA 10/08/2014  Mild cognitive impairment which is stable Most recent lipid profile 08/03/2015 with LDL of 72. Most recent carotid Doppler 08/02/2015 was negative for significant stenosis involving extracranial carotid and vertebral arteries.The patient is a current patient of Dr. Pearlean BrownieSethi  who is out of the office today . This note is sent to the work in doctor.     PLAN: Continue eliquis for second stroke prevention  strict control of hypertension with blood pressure goal below 130/90 continue blood pressure medications  lipids with LDL cholesterol goal below 70 mg percent. Continue Lipitor Continue participation in cognitively challenging activities like playing crossword puzzles, sudoku and bridge. Hitting golf  Balls Continue Cerefolin  will refill Follow-up in 6 months Nilda RiggsNancy Carolyn Mayana Irigoyen, [facility] CenterGNP, [facility] SurgeryBC, APRN  [facility] [address], [address] [place], [place] [address] [phone]

## 2016-04-10 ENCOUNTER — Encounter: Payer: Self-pay | Admitting: Physician Assistant

## 2016-04-11 NOTE — Progress Notes (Signed)
I have read the note, and I agree with the clinical assessment and plan.  Amoree Newlon A. Dyane Broberg, MD, PhD Certified in Neurology, Clinical Neurophysiology, Sleep Medicine, Pain Medicine and Neuroimaging  [facility] [address], [address] [place], [place] [address] [phone]  

## 2016-04-24 DIAGNOSIS — R42 Dizziness and giddiness: Secondary | ICD-10-CM | POA: Diagnosis not present

## 2016-04-24 DIAGNOSIS — I10 Essential (primary) hypertension: Secondary | ICD-10-CM | POA: Diagnosis not present

## 2016-04-24 DIAGNOSIS — N3281 Overactive bladder: Secondary | ICD-10-CM | POA: Diagnosis not present

## 2016-04-30 ENCOUNTER — Ambulatory Visit (HOSPITAL_COMMUNITY): Payer: Medicare Other | Attending: Cardiology

## 2016-04-30 ENCOUNTER — Other Ambulatory Visit: Payer: Self-pay

## 2016-04-30 DIAGNOSIS — I2510 Atherosclerotic heart disease of native coronary artery without angina pectoris: Secondary | ICD-10-CM | POA: Diagnosis not present

## 2016-04-30 DIAGNOSIS — Z8673 Personal history of transient ischemic attack (TIA), and cerebral infarction without residual deficits: Secondary | ICD-10-CM | POA: Insufficient documentation

## 2016-04-30 DIAGNOSIS — I350 Nonrheumatic aortic (valve) stenosis: Secondary | ICD-10-CM | POA: Insufficient documentation

## 2016-04-30 DIAGNOSIS — I10 Essential (primary) hypertension: Secondary | ICD-10-CM | POA: Diagnosis not present

## 2016-05-08 ENCOUNTER — Ambulatory Visit (INDEPENDENT_AMBULATORY_CARE_PROVIDER_SITE_OTHER): Payer: Medicare Other | Admitting: Internal Medicine

## 2016-05-08 VITALS — BP 133/63 | HR 54 | Ht 68.0 in | Wt 166.2 lb

## 2016-05-08 DIAGNOSIS — I2510 Atherosclerotic heart disease of native coronary artery without angina pectoris: Secondary | ICD-10-CM

## 2016-05-08 DIAGNOSIS — I350 Nonrheumatic aortic (valve) stenosis: Secondary | ICD-10-CM | POA: Insufficient documentation

## 2016-05-08 DIAGNOSIS — I480 Paroxysmal atrial fibrillation: Secondary | ICD-10-CM

## 2016-05-08 DIAGNOSIS — I2583 Coronary atherosclerosis due to lipid rich plaque: Secondary | ICD-10-CM

## 2016-05-08 DIAGNOSIS — Z7901 Long term (current) use of anticoagulants: Secondary | ICD-10-CM

## 2016-05-08 DIAGNOSIS — G458 Other transient cerebral ischemic attacks and related syndromes: Secondary | ICD-10-CM | POA: Diagnosis not present

## 2016-05-08 NOTE — Progress Notes (Signed)
OFFICE NOTE  Chief Complaint:  Follow-up echo  Primary Care Physician: Michiel SitesKOHUT,WALTER DENNIS, MD  HPI:  Wyatt [name] is an 82 year old gentleman from [place] originally with history of hypertension, dyslipidemia, paroxysmal A-fib and an MI with a stent to the right coronary in 2001. Recently had a TIA with some vision loss in the right eye. He has some mild aortic stenosis with peak and mean gradients of 22 and 10 mmHg. His EF has been reserved. He was previously on Pradaxa but refused to take that or warfarin and has been on Plavix and aspirin which he seems to be able to tolerate. However, recently he has discontinued the Plavix and is only taking aspirin once or twice per week. In fact, he is not taking many medications including Lipitor which he stopped which he said was worsening his memory loss. He was also started on Namenda, however, stopped taking that as well. At this point, he seems to be content not taking medications although he is at risk for future strokes and/or worsening heart failure. I counseled him extensively about his risk for stroke and my concern for possible atrial fibrillation. He refused to wear a monitor in the past. Unfortunately, he was recently hospitalized in [place] with a presumptive diagnosis of stroke. He did have a syncopal episode, and imaging studies demonstrated bilateral cerebral small vessel disease. He was noted to be hyponatremic.  He had no cardiac monitoring at that time. He is also felt to have acute gout and was placed on steroids which improved his symptoms. Apparently he was unresponsive at home and had collapsed. There is no evidence for cardiac dysrhythmia noted during the hospital stay however he was hyponatremic with a sodium of 125. He refused Holter monitoring at discharge. Ultimately he was sent to [place] in [place] which he completed in 3 weeks.  Since that time I spoken with his wife who noted that he was complaining more of  palpitations. Ultimately he was agreeable to wearing a Holter monitor. The monitor demonstrated new onset atrial flutter at a rate of 162. There were also episodes of atrial fibrillation, PVCs and PSVT which was 10 beats on 03/18/2013. He notes that he does continue to have these episodes.  He was previously on a beta blocker, however this was discontinued by him along with other medications. He does appear to be more depressed today he reports that he actually feels less interested in doing the things he typically would do. He used to play golf frequently and notes that now he has no interest and it anymore and feels that his body is "giving up on him".  Wyatt [name] returns today and as actually feeling quite well. He seems to be tolerating Eliquis without any bleeding problems. He feels that his deficits are almost completely resolved. His wife and he just got back from several months up in [place]. He reports that he is now starting to hit golf balls again. He continues to have problems with short-term memory loss but is not had recurrent atrial fibrillation, chest pain or worsening shortness of breath. He is now on salt tablets for presumed orthostatic symptoms in addition to his blood pressure medications.  I saw Wyatt [name] back in the office today. Overall he is doing well denies any chest pain or worsening shortness of breath. He's had no bleeding problems on Eliquis. He's had no further stroke or TIA that were aware of. Heart rate and blood pressure at goal.  05/08/2016  Wyatt [name] returns today for follow-up. He recently saw Theodore Demark, PA-C in the office who evaluated him for dizziness. She ordered an echocardiogram due to a systolic murmur. He has known aortic stenosis. This indicated normal LV function however his aortic stenosis is unchanged and mild. I don't think this is playing a role in his dizziness. His wife tells me today that yesterday he had what they thought was a TIA event. She  said that for about 40 minutes he had difficulty with word finding and eventually that resolved. He did not present to the emergency department. He apparently recently saw his neurologist.  PMHx:  Past Medical History:  Diagnosis Date  . Anxiety   . Atrial fibrillation (HCC)   . CAD (coronary artery disease)   . CVA (cerebral infarction)    while in [place]  . Diverticulosis   . Hyperlipemia   . Hypertension   . Inferior MI (HCC) 2001   stent to RCA-Dr. Juanda Chance  . Lymphocytic colitis   . Personal history of colonic polyps 04/2009   SERRATED ADENOMA  . Stroke (HCC)   . Syncope and collapse 2014   in [place] prior to the stroke by 24 hours.    Past Surgical History:  Procedure Laterality Date  . CORONARY ANGIOPLASTY WITH STENT PLACEMENT  2001   Dr. Eden Emms cathed, Dr Juanda Chance stented  . TOTAL KNEE ARTHROPLASTY      FAMHx:  Family History  Problem Relation Age of Onset  . Liver cancer Father   . Cancer Sister   . Cancer Sister   . Colon cancer Neg Hx     SOCHx:   reports that he has never smoked. He has never used smokeless tobacco. He reports that he does not drink alcohol or use drugs.  ALLERGIES:  Allergies  Allergen Reactions  . Ampicillin Other (See Comments)    REACTION: unspecified  . Metronidazole Other (See Comments)    Burning skin and hands    ROS: Pertinent items noted in HPI and remainder of comprehensive ROS otherwise negative.  HOME MEDS: Current Outpatient Prescriptions  Medication Sig Dispense Refill  . atorvastatin (LIPITOR) 10 MG tablet Take 1 tablet by mouth every morning.  2  . CIALIS 20 MG tablet Take 20 mg by mouth daily as needed.  0  . ELIQUIS 5 MG TABS tablet TAKE 1 TABLET BY MOUTH TWICE DAILY 180 tablet 1  . L-Methylfolate-B12-B6-B2 (CEREFOLIN) 10-24-48-5 MG TABS Take 1 tablet by mouth daily. 30 each 6  . lisinopril (PRINIVIL,ZESTRIL) 10 MG tablet Take 1 tablet (10 mg total) by mouth daily. NEED OV. 90 tablet 3  . LORazepam (ATIVAN) 0.5 MG  tablet Take 0.5 mg by mouth at bedtime.     . meclizine (ANTIVERT) 12.5 MG tablet   2  . metoprolol tartrate (LOPRESSOR) 25 MG tablet Take 1 tablet (25 mg total) by mouth 2 (two) times daily. PLEASE CONTACT OFFICE FOR ADDITIONAL REFILLS 180 tablet 3  . mirabegron ER (MYRBETRIQ) 50 MG TB24 tablet Take 50 mg by mouth daily.    Marland Kitchen RESVERATROL PO Take 200 mg by mouth daily.     . sodium chloride 1 G tablet Take 1 g by mouth 2 (two) times daily.    . tamsulosin (FLOMAX) 0.4 MG CAPS capsule Reported on 07/26/2015  3   No current facility-administered medications for this visit.     LABS/IMAGING: No results found for this or any previous visit (from the past 48 hour(s)). No results found.  VITALS:  BP 133/63   Pulse (!) 54   Ht 5\' 8"  (1.727 m)   Wt 166 lb 3.2 oz (75.4 kg)   BMI 25.27 kg/m   EXAM: General appearance: alert and no distress Neck: no carotid bruit and no JVD Lungs: clear to auscultation bilaterally Heart: regular rate and rhythm, S1, S2 normal and systolic murmur: midsystolic 3/6, crescendo at 2nd right intercostal space Abdomen: soft, non-tender; bowel sounds normal; no masses,  no organomegaly Extremities: extremities normal, atraumatic, no cyanosis or edema Pulses: 2+ and symmetric Skin: Skin color, texture, turgor normal. No rashes or lesions Neurologic: Grossly normal Psych: Mood, affect normal  EKG: Deferred  ASSESSMENT: 1. Recent TIA 2. Paroxysmal atrial fibrillation/flutter - now in sinus 3. Nonsustained VT and PVCs - resolved 4. Syncope/stroke - started on salt tablets per neurology 5. History of TIAs - on eliquis 6. History of medication noncompliance - improved 7. Dyslipidemia-on statin  PLAN: 1.   Wyatt [name] returns today for follow-up. He underwent an echocardiogram because of dizziness which showed no significant change in LV function with mild aortic stenosis. It does sound like he had an acute TIA event with about 40 minutes of word finding  difficulty yesterday. His wife did not take him to the emergency department. He reports compliance with his Eliquis. He is not on aspirin. I advised her to contact her neurologist for evaluation sooner than their scheduled appointment in April. He may need additional anticoagulation. We will arrange follow-up in our office in April and then I'll plan to see him back after they return from [place] in about one year.  Chrystie NoseKenneth C. Dannell Raczkowski, MD, [facility] CarrolltonFACC Attending Cardiologist [facility]  Chrystie NoseKenneth C Atleigh Gruen 05/08/2016, 1:46 PM

## 2016-05-08 NOTE — Patient Instructions (Signed)
Your physician wants you to follow-up in: 12 months with Dr. Rennis GoldenHilty. You will receive a reminder letter in the mail two months in advance. If you don't receive a letter, please call our office to schedule the follow-up appointment.  Your physician recommends that you schedule a follow-up appointment late April with Bishop Hill[place]onda, [place]

## 2016-07-11 DIAGNOSIS — N401 Enlarged prostate with lower urinary tract symptoms: Secondary | ICD-10-CM | POA: Diagnosis not present

## 2016-07-11 DIAGNOSIS — R3915 Urgency of urination: Secondary | ICD-10-CM | POA: Diagnosis not present

## 2016-07-11 DIAGNOSIS — R350 Frequency of micturition: Secondary | ICD-10-CM | POA: Diagnosis not present

## 2016-08-08 DIAGNOSIS — R350 Frequency of micturition: Secondary | ICD-10-CM | POA: Diagnosis not present

## 2016-08-08 DIAGNOSIS — R3915 Urgency of urination: Secondary | ICD-10-CM | POA: Diagnosis not present

## 2016-08-08 DIAGNOSIS — N401 Enlarged prostate with lower urinary tract symptoms: Secondary | ICD-10-CM | POA: Diagnosis not present

## 2016-09-06 ENCOUNTER — Other Ambulatory Visit: Payer: Self-pay | Admitting: Internal Medicine

## 2016-09-22 ENCOUNTER — Encounter: Payer: Self-pay | Admitting: Nurse Practitioner

## 2016-09-22 ENCOUNTER — Ambulatory Visit (INDEPENDENT_AMBULATORY_CARE_PROVIDER_SITE_OTHER): Payer: Medicare Other | Admitting: Nurse Practitioner

## 2016-09-22 ENCOUNTER — Encounter (INDEPENDENT_AMBULATORY_CARE_PROVIDER_SITE_OTHER): Payer: Self-pay

## 2016-09-22 VITALS — BP 143/70 | HR 54 | Ht 68.0 in | Wt 173.6 lb

## 2016-09-22 DIAGNOSIS — I480 Paroxysmal atrial fibrillation: Secondary | ICD-10-CM | POA: Diagnosis not present

## 2016-09-22 DIAGNOSIS — G3184 Mild cognitive impairment, so stated: Secondary | ICD-10-CM

## 2016-09-22 DIAGNOSIS — R269 Unspecified abnormalities of gait and mobility: Secondary | ICD-10-CM

## 2016-09-22 DIAGNOSIS — I10 Essential (primary) hypertension: Secondary | ICD-10-CM | POA: Diagnosis not present

## 2016-09-22 DIAGNOSIS — Z8673 Personal history of transient ischemic attack (TIA), and cerebral infarction without residual deficits: Secondary | ICD-10-CM

## 2016-09-22 MED ORDER — CEREFOLIN 6-1-50-5 MG PO TABS: 1.0000 | ORAL_TABLET | Freq: Every day | ORAL | 3 refills | Status: AC

## 2016-09-22 NOTE — Progress Notes (Signed)
[facility]  PATIENT: Wyatt [name] DOB: [birth year]   REASON FOR VISIT: Follow-up for mild cognitive impairment, history of TIA hyperlipidemia HISTORY FROM: Patient and wife    HISTORY OF PRESENT ILLNESS:Initial Consult 03/01/13 Wyatt [name] is a 83 year old Caucasian male who had episode of sudden onset of dizziness, gait imbalance and walking like a drunk on 12/22/12 while admitted in the hospital in [place] for workup for a possible syncopal episode. He woke up from sleep and denied any vertigo or headache. He's noticed that he could not walk properly and his balance was off he has trouble coordinating his left side. CT head was unremarkable. He had an MRI scan of the brain done at [facility] which I personally reviewed showed changes of small vessel disease but no acute infarct. MRA of the brain showed no significant large vessel and stenosis.He was seen by neurologist Dr Caroline More who felt he had a small brainstem or cerebellar infarct not visible on MRI due to small vessel disease. He has gotten his extensive medical records about his hospitalization and test results which I have personally reviewed. Carotid ultrasound showed no significant extraction stenosis. Transthoracic echo showed normal ejection fraction with a cardiac source of embolism. He was on baby aspirin which was continued. Vascular status also identified including hypertension hyperlipidemia and is continued on his medications. He was transferred for inpatient rehabilitation to [facility] in [facility] and was discharged a month ago. He is currently undergoing physical therapy as an outpatient and has not been steady improvement in and is now able to walk independently without any assistance. He has long standing history of mild short-term memory difficulties which have been unchanged over several years. He had a fall in January when he hit the back of his head without losing consciousness. Since then  he's been having intermittent headaches which he describes as bitemporal sharp shooting and transient. He was seen by a neurologist in [place] for this and underwent temporal artery biopsy which was negative. Headaches still continue but are getting better and last only 30 seconds. He takes Tylenol which seems to help. He has no known prior history of strokes or TIAs. He states his blood pressures are under good control usually in the 120s at home though it is slightly elevated at 159/102 in office today.  Update 10/29/2015PS ; He returns for followup last visit 7 months ago. He continues to do well from a stroke standpoint without definite stroke or TIA symptoms. The patient's wife however feels he may have had a TIA 2 weeks ago but the description is not highly suggestive. Patient stated he felt funny and nauseous and something was going on but was not very descriptive. He laid down for a few minutes and when he got up he felt okay. He did not have any headache speech balance difficulties of weakness. He continues to have intermittent temporal pain which is infrequent. He rarely needs to take that always seems to work well. He continues to have mild short-term memory difficulties which not progressive and unchanged. He is physically quite active and continues to play golf on a daily basis and he feels this has helped his walking. He does get a few minor bruises on his request but has not had any significant bleeding episodes. He states his blood pressure is well controlled. He has no new complaints.  Update 4/29/2016PS: He returns for follow-up after last visit 6 months ago. Is accompanied by his son eater. Patient and son  both feel that they've noticed some mild progression of her short-term memory difficulties. He still is quite independent in activities of daily living. He lives at home in fact looks after his wife was and neck and shoulder problems. He has been persistently working on his golf game and is  proud to state that he is making good progress. He is still quite active in the yard and working at home. He is tolerating eliquis with minor bruising but no major bleeding episodes. His blood pressure control has been good. He denies any recurrent stroke or TIA symptoms. UPDATE 10/24/2014 PSMr. [name], 83 year old male returns for follow-up. He was last seen in this office 4/29/ 2016 and had overnight admission to the hospital 10/08/2014 for transient episode of inability to get his words out that lasted 10-15 minutes and then he was back to normal. MRI of the brain without evidence of acute CVA carotid duplex 1-39% ICA stenosis. Echocardiogram unchanged. He was placed on Depakote by Dr. Pearlean Brownie and is currently taking 250 twice daily. He has not had further episodes of speech arrest. He did not get EEG in the hospital. He remains on Eliquis for atrial fibrillation and secondary stroke prevention. He returns for reevaluation. He is going to [place] in 2 weeks for 6 months. Update 07/26/2015 PS: He returns for follow-up after last visit 10 months ago. He is accompanied by his son. Patient remains stable from neurovascular as well as cognitive standpoint. He is had no further stroke or TIA symptoms. He is tolerating eliquis well with only minor bruising but no bleeding episodes. The patient discontinued Depakote which had started for episodes of speech arrest possible simple partial seizures because of dizziness and drowsiness but has not had any further episodes despite stopping Depakote. He remains onset of:Marland Kitchen His Mini-Mental status exam testing score today was 25/30 which is stable from last visit. He remains on Cerefolin and Reservetarol He is on sure when he had his last lipid profile checked and he is not had follow-up carotid Dopplers checked in a while. UPDATE 11/15/2017[name], 83 year old male returns for follow-up. He has history of TIA in the past he is currently on Eliquis for secondary stroke  prevention and atrial fibrillation. He is also on Lipitor without muscle myalgias. Most recent lipid profile 08/03/2015 with LDL of 72. Most recent carotid Doppler 08/02/2015 was negative for significant stenosis involving extracranial carotid and vertebral arteries. He continues to be very active. He also has mild cognitive impairment and has been on Cerefolin for  several years. He returns for reevaluation.  UPDATE 04/30/2018[name], 83 year old male returns for follow-up with his daughter. He has a history of TIA in the past and is currently on eliquis for secondary stroke prevention and atrial fibrillation. He has not had further stroke or TIA symptoms. In addition he is on Lipitor for hyperlipidemia. He denies myalgias. Most recent carotid Doppler was negative for significant  Stenosis. Mild cognitive impairment is stable. He remains on Cerefollin. He continues to be active. He is getting ready to go to [place] for 6 months where he has a summer home. He returns for reevaluation  REVIEW OF SYSTEMS: Full 14 system review of systems performed and notable only for those listed, all others are neg:  Constitutional: neg  Cardiovascular: neg Ear/Nose/Throat: neg  Skin: neg Eyes: neg Respiratory: neg Gastroitestinal: Urinary frequency Hematology/Lymphatic: neg  Endocrine: neg Musculoskeletal:neg Allergy/Immunology: neg Neurological: Mild cognitive impairment Psychiatric: neg Sleep : neg   ALLERGIES: Allergies  Allergen Reactions  .  Ampicillin Other (See Comments)    REACTION: unspecified  . Metronidazole Other (See Comments)    Burning skin and hands    HOME MEDICATIONS: Outpatient Medications Prior to Visit  Medication Sig Dispense Refill  . atorvastatin (LIPITOR) 10 MG tablet Take 1 tablet by mouth every morning.  2  . CIALIS 20 MG tablet Take 20 mg by mouth daily as needed.  0  . ELIQUIS 5 MG TABS tablet TAKE 1 TABLET BY MOUTH TWICE DAILY 180 tablet 1  .  L-Methylfolate-B12-B6-B2 (CEREFOLIN) 10-24-48-5 MG TABS Take 1 tablet by mouth daily. 30 each 6  . lisinopril (PRINIVIL,ZESTRIL) 10 MG tablet Take 1 tablet (10 mg total) by mouth daily. NEED OV. 90 tablet 3  . LORazepam (ATIVAN) 0.5 MG tablet Take 0.5 mg by mouth at bedtime.     . meclizine (ANTIVERT) 12.5 MG tablet   2  . metoprolol tartrate (LOPRESSOR) 25 MG tablet Take 1 tablet (25 mg total) by mouth 2 (two) times daily. PLEASE CONTACT OFFICE FOR ADDITIONAL REFILLS 180 tablet 3  . RESVERATROL PO Take 200 mg by mouth daily.     . sodium chloride 1 G tablet Take 1 g by mouth 2 (two) times daily.    . tamsulosin (FLOMAX) 0.4 MG CAPS capsule Reported on 07/26/2015  3  . mirabegron ER (MYRBETRIQ) 50 MG TB24 tablet Take 50 mg by mouth daily.     No facility-administered medications prior to visit.     PAST MEDICAL HISTORY: Past Medical History:  Diagnosis Date  . Anxiety   . Atrial fibrillation (HCC)   . CAD (coronary artery disease)   . CVA (cerebral infarction)    while in [place]  . Diverticulosis   . Hyperlipemia   . Hypertension   . Inferior MI (HCC) 2001   stent to RCA-Dr. Juanda Chance  . Lymphocytic colitis   . Personal history of colonic polyps 04/2009   SERRATED ADENOMA  . Stroke (HCC)   . Syncope and collapse 2014   in [place] prior to the stroke by 24 hours.    PAST SURGICAL HISTORY: Past Surgical History:  Procedure Laterality Date  . CORONARY ANGIOPLASTY WITH STENT PLACEMENT  2001   Dr. Eden Emms cathed, Dr Juanda Chance stented  . TOTAL KNEE ARTHROPLASTY      FAMILY HISTORY: Family History  Problem Relation Age of Onset  . Liver cancer Father   . Cancer Sister   . Cancer Sister   . Colon cancer Neg Hx     SOCIAL HISTORY: Social History   Social History  . Marital status: Married    Spouse name: N/A  . Number of children: 5  . Years of education: college   Occupational History  . Retired    Social History Main Topics  . Smoking status: Never Smoker  . Smokeless  tobacco: Never Used  . Alcohol use No  . Drug use: No  . Sexual activity: Not on file   Other Topics Concern  . Not on file   Social History Narrative   Patient is married with 5 children.   Patient is right handed.   Patient has college education.   Patient drinks 3 cups daily.     PHYSICAL EXAM  Vitals:   09/22/16 0954  BP: (!) 143/70  Pulse: (!) 54  Weight: 173 lb 9.6 oz (78.7 kg)  Height:  (1.727 m)   Body mass index is 26.4 kg/m.  Generalized: Well developed, in no acute distress  Head: normocephalic and  atraumatic,. Oropharynx benign  Neck: Supple, no carotid bruits  Cardiac: Regular rate rhythm, no murmur  Musculoskeletal: No deformity   Neurological examination   Mentation: Alert oriented to time, place, history taking. Attention span and concentration appropriate.   Follows all commands speech and language fluent. MMSE 25/30.   Cranial nerve II-XII: Pupils were equal round reactive to light extraocular movements were full, visual field were full on confrontational test. Facial sensation and strength were normal. hearing diminished on the right . Uvula tongue midline. head turning and shoulder shrug were normal and symmetric.Tongue protrusion into cheek strength was normal. Motor: normal bulk and tone, full strength in the BUE, BLE, fine finger movements normal, no pronator drift. No focal weakness Sensory: normal and symmetric to light touch, pinprick, and  Vibration, in the upper and lower extremities  Coordination: finger-nose-finger, heel-to-shin bilaterally, no dysmetria Reflexes: 1+ upper lower and symmetric except ankle jerks are depressed, plantar responses were flexor bilaterally. Gait and Station: Rising up from seated position without assistance, normal stance,  moderate stride, good arm swing, smooth turning, able to perform tiptoe, and heel walking without difficulty. Tandem gait is steady. He walks with a limp. No assistive device  DIAGNOSTIC  DATA (LABS, IMAGING, TESTING) - I reviewed patient records, labs, notes, testing and imaging myself where available.  Lab Results  Component Value Date   WBC 7.5 10/07/2014   HGB 14.6 10/07/2014   HCT 41.4 10/07/2014   MCV 92.4 10/07/2014   PLT 156 10/07/2014      Component Value Date/Time   NA 134 (L) 10/08/2014 0736   K 4.0 10/08/2014 0736   CL 97 (L) 10/08/2014 0736   CO2 27 10/08/2014 0736   GLUCOSE 119 (H) 10/08/2014 0736   BUN 10 10/08/2014 0736   CREATININE 0.92 10/08/2014 0736   CALCIUM 9.6 10/08/2014 0736   PROT 6.3 (L) 10/07/2014 1223   ALBUMIN 4.0 10/07/2014 1223   AST 25 10/07/2014 1223   ALT 20 10/07/2014 1223   ALKPHOS 51 10/07/2014 1223   BILITOT 1.1 10/07/2014 1223   GFRNONAA >60 10/08/2014 0736   GFRAA >60 10/08/2014 0736   Lab Results  Component Value Date   CHOL 151 08/02/2015   HDL 66 08/02/2015   LDLCALC 72 08/02/2015   TRIG 66 08/02/2015   CHOLHDL 2.3 08/02/2015   Lab Results  Component Value Date   HGBA1C 5.5 10/08/2014  ASSESSMENT AND PLAN 83 y.o. year old male  has a past medical history of Hypertension; Hyperlipemia; Atrial fibrillation;  (04/2009); Cardiac arrhythmia;  CAD (coronary artery disease); Inferior MI (2001); CVA (cerebral infarction); and Syncope and collapse (2014). and episodes of speech arrest here to follow-up. Admission overnight for questionable TIA 10/08/2014  Mild cognitive impairment which is stable Most recent lipid profile 08/03/2015 with LDL of 72. Most recent carotid Doppler 08/02/2015 was negative for significant stenosis involving extracranial carotid and vertebral arteries.The patient is a current patient of Dr. Pearlean Brownie  who is out of the office today . This note is sent to the work in doctor.     PLAN: Continue eliquis for second stroke prevention  strict control of hypertension with blood pressure goal below 130/90 continue blood pressure medications  lipids with LDL cholesterol goal below 70 mg percent. Continue  Lipitor Continue participation in cognitively challenging activities like playing crossword puzzles, sudoku and bridge. Hitting golf  Balls, walking . MMSE stable Continue Cerefolin  will refill for 1 year then obtain from PCP. Discharge from stroke clinic I  spent 25 min  in total face to face time with the patient more than 50% of which was spent counseling and coordination of care, reviewing test results reviewing medications and discussing and reviewing the diagnosis of stroke, and monitoring risk factors. Also mild cognitive impairment and suggestions for memory exercises, Nilda Riggs, [facility], [facility], APRN  [facility] [address], [address] [place], [place] [address] [phone]

## 2016-09-22 NOTE — Patient Instructions (Addendum)
Continue eliquis for second stroke prevention  strict control of hypertension with blood pressure goal below 130/90 continue blood pressure medications  lipids with LDL cholesterol goal below 70 mg percent. Continue Lipitor Continue participation in cognitively challenging activities like playing crossword puzzles, sudoku and bridge. Hitting golf  Balls, walking  Continue Cerefolin  will refill for 1 year then obtain from PCP. Discharge from stroke clinic Stroke Prevention Some medical conditions and behaviors are associated with an increased chance of having a stroke. You may prevent a stroke by making healthy choices and managing medical conditions. How can I reduce my risk of having a stroke?  Stay physically active. Get at least 30 minutes of activity on most or all days.  Do not smoke. It may also be helpful to avoid exposure to secondhand smoke.  Limit alcohol use. Moderate alcohol use is considered to be:  No more than 2 drinks per day for men.  No more than 1 drink per day for nonpregnant women.  Eat healthy foods. This involves:  Eating 5 or more servings of fruits and vegetables a day.  Making dietary changes that address high blood pressure (hypertension), high cholesterol, diabetes, or obesity.  Manage your cholesterol levels.  Making food choices that are high in fiber and low in saturated fat, trans fat, and cholesterol may control cholesterol levels.  Take any prescribed medicines to control cholesterol as directed by your health care provider.  Manage your diabetes.  Controlling your carbohydrate and sugar intake is recommended to manage diabetes.  Take any prescribed medicines to control diabetes as directed by your health care provider.  Control your hypertension.  Making food choices that are low in salt (sodium), saturated fat, trans fat, and cholesterol is recommended to manage hypertension.  Ask your health care provider if you need treatment to lower  your blood pressure. Take any prescribed medicines to control hypertension as directed by your health care provider.  If you are 40-5 years of age, have your blood pressure checked every 3-5 years. If you are 62 years of age or older, have your blood pressure checked every year.  Maintain a healthy weight.  Reducing calorie intake and making food choices that are low in sodium, saturated fat, trans fat, and cholesterol are recommended to manage weight.  Stop drug abuse.  Avoid taking birth control pills.  Talk to your health care provider about the risks of taking birth control pills if you are over 80 years old, smoke, get migraines, or have ever had a blood clot.  Get evaluated for sleep disorders (sleep apnea).  Talk to your health care provider about getting a sleep evaluation if you snore a lot or have excessive sleepiness.  Take medicines only as directed by your health care provider.  For some people, aspirin or blood thinners (anticoagulants) are helpful in reducing the risk of forming abnormal blood clots that can lead to stroke. If you have the irregular heart rhythm of atrial fibrillation, you should be on a blood thinner unless there is a good reason you cannot take them.  Understand all your medicine instructions.  Make sure that other conditions (such as anemia or atherosclerosis) are addressed. Get help right away if:  You have sudden weakness or numbness of the face, arm, or leg, especially on one side of the body.  Your face or eyelid droops to one side.  You have sudden confusion.  You have trouble speaking (aphasia) or understanding.  You have sudden trouble seeing in  one or both eyes.  You have sudden trouble walking.  You have dizziness.  You have a loss of balance or coordination.  You have a sudden, severe headache with no known cause.  You have new chest pain or an irregular heartbeat. Any of these symptoms may represent a serious problem that is  an emergency. Do not wait to see if the symptoms will go away. Get medical help at once. Call your local emergency services (911 in [place] Do not drive yourself to the hospital. This information is not intended to replace advice given to you by your health care provider. Make sure you discuss any questions you have with your health care provider. Document Released: 06/19/2004 Document Revised: 10/18/2015 Document Reviewed: 11/12/2012 Elsevier Interactive Patient Education  2017 [employer].

## 2016-09-23 NOTE — Progress Notes (Signed)
I have read the note, and I agree with the clinical assessment and plan.  Estelle Skibicki A. Jazon Jipson, MD, PhD Certified in Neurology, Clinical Neurophysiology, Sleep Medicine, Pain Medicine and Neuroimaging  [facility] [address], [address] [place], [place] [address] [phone]  

## 2016-10-02 DIAGNOSIS — N3281 Overactive bladder: Secondary | ICD-10-CM | POA: Diagnosis not present

## 2017-01-06 DIAGNOSIS — I10 Essential (primary) hypertension: Secondary | ICD-10-CM | POA: Diagnosis not present

## 2017-01-06 DIAGNOSIS — F0391 Unspecified dementia with behavioral disturbance: Secondary | ICD-10-CM | POA: Diagnosis not present

## 2017-01-06 DIAGNOSIS — G4700 Insomnia, unspecified: Secondary | ICD-10-CM | POA: Diagnosis not present

## 2017-02-17 DIAGNOSIS — H35373 Puckering of macula, bilateral: Secondary | ICD-10-CM | POA: Diagnosis not present

## 2017-02-17 DIAGNOSIS — H40013 Open angle with borderline findings, low risk, bilateral: Secondary | ICD-10-CM | POA: Diagnosis not present

## 2017-03-01 ENCOUNTER — Other Ambulatory Visit: Payer: Self-pay | Admitting: Internal Medicine

## 2017-03-23 DIAGNOSIS — E871 Hypo-osmolality and hyponatremia: Secondary | ICD-10-CM | POA: Diagnosis not present

## 2017-03-23 DIAGNOSIS — I10 Essential (primary) hypertension: Secondary | ICD-10-CM | POA: Diagnosis not present

## 2017-03-23 DIAGNOSIS — F0280 Dementia in other diseases classified elsewhere without behavioral disturbance: Secondary | ICD-10-CM | POA: Diagnosis not present

## 2017-03-23 DIAGNOSIS — I482 Chronic atrial fibrillation: Secondary | ICD-10-CM | POA: Diagnosis not present

## 2017-03-23 DIAGNOSIS — Z23 Encounter for immunization: Secondary | ICD-10-CM | POA: Diagnosis not present

## 2017-03-23 DIAGNOSIS — E785 Hyperlipidemia, unspecified: Secondary | ICD-10-CM | POA: Diagnosis not present

## 2017-03-23 DIAGNOSIS — G301 Alzheimer's disease with late onset: Secondary | ICD-10-CM | POA: Diagnosis not present

## 2017-03-25 DIAGNOSIS — E871 Hypo-osmolality and hyponatremia: Secondary | ICD-10-CM | POA: Diagnosis not present

## 2017-03-25 DIAGNOSIS — I10 Essential (primary) hypertension: Secondary | ICD-10-CM | POA: Diagnosis not present

## 2017-03-25 DIAGNOSIS — E785 Hyperlipidemia, unspecified: Secondary | ICD-10-CM | POA: Diagnosis not present

## 2017-06-02 DIAGNOSIS — Z125 Encounter for screening for malignant neoplasm of prostate: Secondary | ICD-10-CM | POA: Diagnosis not present

## 2017-06-02 DIAGNOSIS — I639 Cerebral infarction, unspecified: Secondary | ICD-10-CM | POA: Diagnosis not present

## 2017-06-02 DIAGNOSIS — I10 Essential (primary) hypertension: Secondary | ICD-10-CM | POA: Diagnosis not present

## 2017-06-02 DIAGNOSIS — F419 Anxiety disorder, unspecified: Secondary | ICD-10-CM | POA: Diagnosis not present

## 2017-06-09 ENCOUNTER — Ambulatory Visit (INDEPENDENT_AMBULATORY_CARE_PROVIDER_SITE_OTHER): Payer: Medicare Other | Admitting: Internal Medicine

## 2017-06-09 ENCOUNTER — Encounter: Payer: Self-pay | Admitting: Internal Medicine

## 2017-06-09 VITALS — BP 142/68 | HR 67 | Ht 68.0 in | Wt 174.6 lb

## 2017-06-09 DIAGNOSIS — I350 Nonrheumatic aortic (valve) stenosis: Secondary | ICD-10-CM | POA: Diagnosis not present

## 2017-06-09 DIAGNOSIS — I480 Paroxysmal atrial fibrillation: Secondary | ICD-10-CM

## 2017-06-09 DIAGNOSIS — Z7901 Long term (current) use of anticoagulants: Secondary | ICD-10-CM | POA: Diagnosis not present

## 2017-06-09 DIAGNOSIS — Z79899 Other long term (current) drug therapy: Secondary | ICD-10-CM | POA: Diagnosis not present

## 2017-06-09 DIAGNOSIS — I2510 Atherosclerotic heart disease of native coronary artery without angina pectoris: Secondary | ICD-10-CM

## 2017-06-09 NOTE — Patient Instructions (Signed)
Your physician recommends that you return for lab work TODAY  Your physician has requested that you have an echocardiogram in 1 year prior to your next visit with Dr. Rennis GoldenHilty. Echocardiography is a painless test that uses sound waves to create images of your heart. It provides your doctor with information about the size and shape of your heart and how well your heart's chambers and valves are working. This procedure takes approximately one hour. There are no restrictions for this procedure. Test [address] done at [address]  Your physician wants you to follow-up in: ONE YEAR with Dr. Rennis GoldenHilty. You will receive a reminder letter in the mail two months in advance. If you don't receive a letter, please call our office to schedule the follow-up appointment.

## 2017-06-09 NOTE — Progress Notes (Signed)
OFFICE NOTE  Chief Complaint:  Routine follow-up  Primary Care Physician: Pearson Grippe, MD  HPI:  Wyatt [name] is an 84 year old gentleman from [place] originally with history of hypertension, dyslipidemia, paroxysmal A-fib and an MI with a stent to the right coronary in 2001. Recently had a TIA with some vision loss in the right eye. He has some mild aortic stenosis with peak and mean gradients of 22 and 10 mmHg. His EF has been reserved. He was previously on Pradaxa but refused to take that or warfarin and has been on Plavix and aspirin which he seems to be able to tolerate. However, recently he has discontinued the Plavix and is only taking aspirin once or twice per week. In fact, he is not taking many medications including Lipitor which he stopped which he said was worsening his memory loss. He was also started on Namenda, however, stopped taking that as well. At this point, he seems to be content not taking medications although he is at risk for future strokes and/or worsening heart failure. I counseled him extensively about his risk for stroke and my concern for possible atrial fibrillation. He refused to wear a monitor in the past. Unfortunately, he was recently hospitalized in [place] with a presumptive diagnosis of stroke. He did have a syncopal episode, and imaging studies demonstrated bilateral cerebral small vessel disease. He was noted to be hyponatremic.  He had no cardiac monitoring at that time. He is also felt to have acute gout and was placed on steroids which improved his symptoms. Apparently he was unresponsive at home and had collapsed. There is no evidence for cardiac dysrhythmia noted during the hospital stay however he was hyponatremic with a sodium of 125. He refused Holter monitoring at discharge. Ultimately he was sent to rehabilitation in [place] which he completed in 3 weeks.  Since that time I spoken with his wife who noted that he was complaining more of  palpitations. Ultimately he was agreeable to wearing a Holter monitor. The monitor demonstrated new onset atrial flutter at a rate of 162. There were also episodes of atrial fibrillation, PVCs and PSVT which was 10 beats on 03/18/2013. He notes that he does continue to have these episodes.  He was previously on a beta blocker, however this was discontinued by him along with other medications. He does appear to be more depressed today he reports that he actually feels less interested in doing the things he typically would do. He used to play golf frequently and notes that now he has no interest and it anymore and feels that his body is "giving up on him".  Wyatt [name] returns today and as actually feeling quite well. He seems to be tolerating Eliquis without any bleeding problems. He feels that his deficits are almost completely resolved. His wife and he just got back from several months up in [place]. He reports that he is now starting to hit golf balls again. He continues to have problems with short-term memory loss but is not had recurrent atrial fibrillation, chest pain or worsening shortness of breath. He is now on salt tablets for presumed orthostatic symptoms in addition to his blood pressure medications.  I saw Mr. Sharps back in the office today. Overall he is doing well denies any chest pain or worsening shortness of breath. He's had no bleeding problems on Eliquis. He's had no further stroke or TIA that were aware of. Heart rate and blood pressure at goal.  05/08/2016  Wyatt [name] returns today for follow-up. He recently saw Theodore Demark, PA-C in the office who evaluated him for dizziness. She ordered an echocardiogram due to a systolic murmur. He has known aortic stenosis. This indicated normal LV function however his aortic stenosis is unchanged and mild. I don't think this is playing a role in his dizziness. His wife tells me today that yesterday he had what they thought was a TIA event. She  said that for about 40 minutes he had difficulty with word finding and eventually that resolved. He did not present to the emergency department. He apparently recently saw his neurologist.  06/10/2017  Wyatt [name] was seen today in follow-up.  He denies any chest pain or worsening shortness of breath.  His wife says that his memory has been worsening somewhat.  They are back from [place] for several months.  Blood pressure today was reasonably well controlled.  He does have an aortic murmur which demonstrated mild aortic stenosis on his last echo.  He will be due for another echo next year.  His wife said he has had a couple episodes with speech difficulty which she felt may be TIA events, but no other significant strokes.  He is done well on Eliquis without any bleeding problems.  Recent labs were brought from [place] and indicating a normal metabolic profile, creatinine of 1.02 and a lipid profile with total cholesterol 143, triglycerides 101, HDL 57 and LDL 66 indicating he is at goal.  PMHx:  Past Medical History:  Diagnosis Date  . Anxiety   . Atrial fibrillation (HCC)   . CAD (coronary artery disease)   . CVA (cerebral infarction)    while in [place]  . Diverticulosis   . Hyperlipemia   . Hypertension   . Inferior MI (HCC) 2001   stent to RCA-Dr. Juanda Chance  . Lymphocytic colitis   . Personal history of colonic polyps 04/2009   SERRATED ADENOMA  . Stroke (HCC)   . Syncope and collapse 2014   in [place] prior to the stroke by 24 hours.    Past Surgical History:  Procedure Laterality Date  . CORONARY ANGIOPLASTY WITH STENT PLACEMENT  2001   Dr. Eden Emms cathed, Dr Juanda Chance stented  . TOTAL KNEE ARTHROPLASTY      FAMHx:  Family History  Problem Relation Age of Onset  . Liver cancer Father   . Cancer Sister   . Cancer Sister   . Colon cancer Neg Hx     SOCHx:   reports that  has never smoked. he has never used smokeless tobacco. He reports that he does not drink alcohol or use  drugs.  ALLERGIES:  Allergies  Allergen Reactions  . Ampicillin Other (See Comments)    REACTION: unspecified  . Metronidazole Other (See Comments)    Burning skin and hands    ROS: Pertinent items noted in HPI and remainder of comprehensive ROS otherwise negative.  HOME MEDS: Current Outpatient Medications  Medication Sig Dispense Refill  . atorvastatin (LIPITOR) 10 MG tablet Take 1 tablet by mouth every morning.  2  . ELIQUIS 5 MG TABS tablet TAKE 1 TABLET BY MOUTH TWICE DAILY 180 tablet 1  . L-Methylfolate-B12-B6-B2 (CEREFOLIN) 10-24-48-5 MG TABS Take 1 tablet by mouth daily. 90 each 3  . lisinopril (PRINIVIL,ZESTRIL) 10 MG tablet Take 1 tablet (10 mg total) by mouth daily. NEED OV. 90 tablet 3  . LORazepam (ATIVAN) 0.5 MG tablet Take 0.5 mg by mouth at bedtime.     Marland Kitchen  metoprolol tartrate (LOPRESSOR) 25 MG tablet Take 12.5 mg by mouth 2 (two) times daily.    . sodium chloride 1 G tablet Take 1 g by mouth 2 (two) times daily.    . tamsulosin (FLOMAX) 0.4 MG CAPS capsule Take 0.4 mg by mouth daily.     No current facility-administered medications for this visit.     LABS/IMAGING: No results found for this or any previous visit (from the past 48 hour(s)). No results found.  VITALS: BP (!) 142/68   Pulse 67   Ht 5\' 8"  (1.727 m)   Wt 174 lb 9.6 oz (79.2 kg)   BMI 26.55 kg/m   EXAM: General appearance: alert and no distress Neck: no carotid bruit and no JVD Lungs: clear to auscultation bilaterally Heart: regular rate and rhythm, S1, S2 normal and systolic murmur: midsystolic 3/6, crescendo at 2nd right intercostal space Abdomen: soft, non-tender; bowel sounds normal; no masses,  no organomegaly Extremities: extremities normal, atraumatic, no cyanosis or edema Pulses: 2+ and symmetric Skin: Skin color, texture, turgor normal. No rashes or lesions Neurologic: Grossly normal Psych: Mood, affect normal  EKG: Sinus rhythm at 67, right bundle branch block-personally  reviewed  ASSESSMENT: 1. History of TIA 2. Paroxysmal atrial fibrillation/flutter - now in sinus 3. Nonsustained VT and PVCs - resolved 4. Syncope/stroke - started on salt tablets per neurology 5. History of TIAs - on eliquis  6. Dyslipidemia-on statin  PLAN: 1.   [name] seems to be doing well although has had progressive memory problems.  He has had no recurrent VT or AF that were aware of.  His wife suggested that he has had some TIA events which is manifest by speech difficulty and memory issues, but may be related more to progressive dementia rather than TIA.  He is done well on Eliquis without any bleeding problems.  He seems to be more compliant with his medications.  Follow-up annually with an echo at that time.  Chrystie NoseKenneth C. Beckham Buxbaum, MD, [facility] LincolnFACC, FACP  [place]  [facility]  Medical Director of the [facility]  [facility] Diplomate of the American Board of Clinical Lipidology Attending Cardiologist  Direct Dial: [phone]  Fax: [phone]  Website:  [email or web address]  Shaheer Bonfield C Izmael Duross 06/09/2017, 3:35 PM

## 2017-06-10 ENCOUNTER — Encounter: Payer: Self-pay | Admitting: Internal Medicine

## 2017-06-10 DIAGNOSIS — Z7901 Long term (current) use of anticoagulants: Secondary | ICD-10-CM | POA: Insufficient documentation

## 2017-06-10 LAB — CBC
HEMOGLOBIN: 14.4 g/dL (ref 13.0–17.7)
Hematocrit: 42 % (ref 37.5–51.0)
MCH: 33.3 pg — AB (ref 26.6–33.0)
MCHC: 34.3 g/dL (ref 31.5–35.7)
MCV: 97 fL (ref 79–97)
Platelets: 204 10*3/uL (ref 150–379)
RBC: 4.33 x10E6/uL (ref 4.14–5.80)
RDW: 13.5 % (ref 12.3–15.4)
WBC: 9 10*3/uL (ref 3.4–10.8)

## 2017-06-10 LAB — BASIC METABOLIC PANEL
BUN / CREAT RATIO: 15 (ref 10–24)
BUN: 14 mg/dL (ref 8–27)
CO2: 24 mmol/L (ref 20–29)
CREATININE: 0.96 mg/dL (ref 0.76–1.27)
Calcium: 9.3 mg/dL (ref 8.6–10.2)
Chloride: 92 mmol/L — ABNORMAL LOW (ref 96–106)
GFR calc Af Amer: 82 mL/min/{1.73_m2} (ref >59)
GFR calc non Af Amer: 71 mL/min/{1.73_m2} (ref >59)
Glucose: 108 mg/dL — ABNORMAL HIGH (ref 65–99)
Potassium: 4.7 mmol/L (ref 3.5–5.2)
SODIUM: 129 mmol/L — AB (ref 134–144)

## 2017-07-14 ENCOUNTER — Other Ambulatory Visit: Payer: Self-pay | Admitting: Physician Assistant

## 2017-07-14 DIAGNOSIS — I350 Nonrheumatic aortic (valve) stenosis: Secondary | ICD-10-CM

## 2017-07-14 NOTE — Telephone Encounter (Signed)
Rx(s) sent to pharmacy electronically.  

## 2017-07-16 ENCOUNTER — Emergency Department (HOSPITAL_COMMUNITY): Payer: Medicare Other

## 2017-07-16 ENCOUNTER — Encounter (HOSPITAL_COMMUNITY): Payer: Self-pay | Admitting: *Deleted

## 2017-07-16 ENCOUNTER — Other Ambulatory Visit: Payer: Self-pay

## 2017-07-16 ENCOUNTER — Emergency Department (HOSPITAL_COMMUNITY)
Admission: EM | Admit: 2017-07-16 | Discharge: 2017-07-16 | Disposition: A | Discharge status: Home / Self Care | Payer: Medicare Other | Attending: Emergency Medicine | Admitting: Emergency Medicine

## 2017-07-16 ENCOUNTER — Telehealth: Payer: Self-pay | Admitting: Nurse Practitioner

## 2017-07-16 DIAGNOSIS — Z955 Presence of coronary angioplasty implant and graft: Secondary | ICD-10-CM | POA: Insufficient documentation

## 2017-07-16 DIAGNOSIS — R4182 Altered mental status, unspecified: Secondary | ICD-10-CM | POA: Diagnosis not present

## 2017-07-16 DIAGNOSIS — Z7901 Long term (current) use of anticoagulants: Secondary | ICD-10-CM | POA: Insufficient documentation

## 2017-07-16 DIAGNOSIS — F0390 Unspecified dementia without behavioral disturbance: Secondary | ICD-10-CM | POA: Insufficient documentation

## 2017-07-16 DIAGNOSIS — Z96659 Presence of unspecified artificial knee joint: Secondary | ICD-10-CM | POA: Insufficient documentation

## 2017-07-16 DIAGNOSIS — I2510 Atherosclerotic heart disease of native coronary artery without angina pectoris: Secondary | ICD-10-CM | POA: Diagnosis not present

## 2017-07-16 DIAGNOSIS — R410 Disorientation, unspecified: Secondary | ICD-10-CM | POA: Diagnosis present

## 2017-07-16 DIAGNOSIS — Z79899 Other long term (current) drug therapy: Secondary | ICD-10-CM | POA: Diagnosis not present

## 2017-07-16 DIAGNOSIS — R4781 Slurred speech: Secondary | ICD-10-CM | POA: Diagnosis not present

## 2017-07-16 DIAGNOSIS — G459 Transient cerebral ischemic attack, unspecified: Secondary | ICD-10-CM

## 2017-07-16 DIAGNOSIS — I10 Essential (primary) hypertension: Secondary | ICD-10-CM | POA: Diagnosis not present

## 2017-07-16 DIAGNOSIS — I252 Old myocardial infarction: Secondary | ICD-10-CM | POA: Insufficient documentation

## 2017-07-16 DIAGNOSIS — R531 Weakness: Secondary | ICD-10-CM | POA: Diagnosis not present

## 2017-07-16 LAB — ETHANOL: Alcohol, Ethyl (B): <10 mg/dL (ref <10)

## 2017-07-16 LAB — URINALYSIS, ROUTINE W REFLEX MICROSCOPIC
Bilirubin Urine: NEGATIVE
Glucose, UA: NEGATIVE mg/dL
Hgb urine dipstick: NEGATIVE
Ketones, ur: NEGATIVE mg/dL
Leukocytes, UA: NEGATIVE
Nitrite: NEGATIVE
PROTEIN: NEGATIVE mg/dL
Specific Gravity, Urine: 1.011 (ref 1.005–1.030)
pH: 6 (ref 5.0–8.0)

## 2017-07-16 LAB — CBC WITH DIFFERENTIAL/PLATELET
BASOS ABS: 0 10*3/uL (ref 0.0–0.1)
BASOS PCT: 0 %
EOS ABS: 0.3 10*3/uL (ref 0.0–0.7)
Eosinophils Relative: 3 %
HCT: 43.8 % (ref 39.0–52.0)
HEMOGLOBIN: 15.4 g/dL (ref 13.0–17.0)
Lymphocytes Relative: 21 %
Lymphs Abs: 1.7 10*3/uL (ref 0.7–4.0)
MCH: 33.8 pg (ref 26.0–34.0)
MCHC: 35.2 g/dL (ref 30.0–36.0)
MCV: 96.1 fL (ref 78.0–100.0)
MONOS PCT: 6 %
Monocytes Absolute: 0.5 10*3/uL (ref 0.1–1.0)
NEUTROS PCT: 70 %
Neutro Abs: 5.8 10*3/uL (ref 1.7–7.7)
Platelets: 177 10*3/uL (ref 150–400)
RBC: 4.56 MIL/uL (ref 4.22–5.81)
RDW: 13.4 % (ref 11.5–15.5)
WBC: 8.2 10*3/uL (ref 4.0–10.5)

## 2017-07-16 LAB — COMPREHENSIVE METABOLIC PANEL
ALK PHOS: 56 U/L (ref 38–126)
ALT: 21 U/L (ref 17–63)
ANION GAP: 10 (ref 5–15)
AST: 27 U/L (ref 15–41)
Albumin: 3.7 g/dL (ref 3.5–5.0)
BUN: 12 mg/dL (ref 6–20)
CALCIUM: 9 mg/dL (ref 8.9–10.3)
CO2: 24 mmol/L (ref 22–32)
Chloride: 98 mmol/L — ABNORMAL LOW (ref 101–111)
Creatinine, Ser: 1.02 mg/dL (ref 0.61–1.24)
GFR calc Af Amer: >60 mL/min (ref >60)
GFR calc non Af Amer: >60 mL/min (ref >60)
GLUCOSE: 120 mg/dL — AB (ref 65–99)
POTASSIUM: 4 mmol/L (ref 3.5–5.1)
SODIUM: 132 mmol/L — AB (ref 135–145)
Total Bilirubin: 0.8 mg/dL (ref 0.3–1.2)
Total Protein: 5.9 g/dL — ABNORMAL LOW (ref 6.5–8.1)

## 2017-07-16 LAB — I-STAT TROPONIN, ED: TROPONIN I, POC: 0 ng/mL (ref 0.00–0.08)

## 2017-07-16 LAB — I-STAT CHEM 8, ED
BUN: 13 mg/dL (ref 6–20)
CHLORIDE: 96 mmol/L — AB (ref 101–111)
Calcium, Ion: 1.15 mmol/L (ref 1.15–1.40)
Creatinine, Ser: 1 mg/dL (ref 0.61–1.24)
Glucose, Bld: 122 mg/dL — ABNORMAL HIGH (ref 65–99)
HEMATOCRIT: 44 % (ref 39.0–52.0)
HEMOGLOBIN: 15 g/dL (ref 13.0–17.0)
POTASSIUM: 4.1 mmol/L (ref 3.5–5.1)
SODIUM: 134 mmol/L — AB (ref 135–145)
TCO2: 26 mmol/L (ref 22–32)

## 2017-07-16 LAB — PROTIME-INR
INR: 1.16
Prothrombin Time: 14.7 seconds (ref 11.4–15.2)

## 2017-07-16 NOTE — Progress Notes (Signed)
EEG complete - results pending 

## 2017-07-16 NOTE — Discharge Instructions (Addendum)
We've placed an order for home health/aides to come out. I highly recommend seeking help at home to help with your symptom management.  We will follow-up your EEG and call if this is abnormal. You should follow-up with your Neurologist. Call tomorrow AM to discuss your visit and results.

## 2017-07-16 NOTE — ED Provider Notes (Signed)
[facility] EMERGENCY DEPARTMENT Provider Note   CSN: [id number] Arrival date & time: 07/16/17  1191     History   Chief Complaint Chief Complaint  Patient presents with  . Transient Ischemic Attack    HPI Wyatt [name] is a 84 y.o. male.  HPI   84 year old male with history of MI, aortic stenosis, A. fib, on anticoagulation, here with increasing episode of confusion and speech difficulty.  History provided by the patient's wife due to severe issues with memory.  The patient reportedly has had increasingly frequent episodes in which he experiences what sounds like an acute onset of expressive aphasia.  Earlier today, the patient was in his usual state of health upon awakening around 6 AM.  Around 7-730, he was drinking coffee when he began to speak and not make sense.  He became agitated at that time and asked for help.  He seemed to be understanding things.  This then resolved over 1-2 hours.  The patient has had multiple previous TIAs with similar symptoms, but they are increasing in frequency as well as severity and duration.  Of note, the patient is also experiencing increasing confusion, occasional visual hallucinations, and is having more difficulty getting around and taking care of himself at home.  He lives with his elderly wife.  Denies any recent fevers or chills.  No recent trauma.  On my assessment, the patient is without complaints.  Past Medical History:  Diagnosis Date  . Anxiety   . Atrial fibrillation (HCC)   . CAD (coronary artery disease)   . CVA (cerebral infarction)    while in [place]  . Diverticulosis   . Hyperlipemia   . Hypertension   . Inferior MI (HCC) 2001   stent to RCA-Dr. Juanda Chance  . Lymphocytic colitis   . Personal history of colonic polyps 04/2009   SERRATED ADENOMA  . Stroke (HCC)   . Syncope and collapse 2014   in [place] prior to the stroke by 24 hours.    Patient Active Problem List   Diagnosis Date Noted  . On  anticoagulant therapy 06/10/2017  . Aortic valve stenosis 05/08/2016  . MCI (mild cognitive impairment) 07/26/2015  . Dysphasia 10/07/2014  . Hyponatremia 10/07/2014  . TIA (transient ischemic attack)   . Mild cognitive impairment with memory loss 09/22/2014  . History of stroke 04/11/2014  . Mild cognitive impairment 08/11/2013  . Long term current use of anticoagulant therapy 04/11/2013  . PAF (paroxysmal atrial fibrillation) (HCC) 03/28/2013  . Atrial flutter (HCC) 03/28/2013  . Syncope 03/28/2013  . Hyposmolality and/or hyponatremia 03/28/2013  . Palpitations 03/15/2013  . Frequent PVCs 03/15/2013  . CAD in native artery 03/15/2013  . Abnormality of gait 03/01/2013  . Essential hypertension 03/01/2013  . Essential familial hyperlipidemia 03/01/2013  . Vitamin B12 deficiency 09/12/2010  . Diarrhea 08/26/2010    Past Surgical History:  Procedure Laterality Date  . CORONARY ANGIOPLASTY WITH STENT PLACEMENT  2001   Dr. Eden Emms cathed, Dr Juanda Chance stented  . TOTAL KNEE ARTHROPLASTY         Home Medications    Prior to Admission medications   Medication Sig Start Date End Date Taking? Authorizing Provider  atorvastatin (LIPITOR) 10 MG tablet Take 1 tablet by mouth every morning. 03/30/16  Yes [provider]  ELIQUIS 5 MG TABS tablet TAKE 1 TABLET BY MOUTH TWICE DAILY 03/02/17  Yes Hilty, Lisette Abu, MD  L-Methylfolate-B12-B6-B2 (CEREFOLIN) 10-24-48-5 MG TABS Take 1 tablet by mouth daily.  Patient taking differently: Take 1 tablet by mouth every evening.  09/22/16  Yes Nilda RiggsMartin, Nancy Carolyn, NP  lisinopril (PRINIVIL,ZESTRIL) 10 MG tablet Take 1 tablet (10 mg total) by mouth daily. Patient taking differently: Take 10 mg by mouth every morning.  07/14/17  Yes Hilty, Lisette AbuKenneth C, MD  LORazepam (ATIVAN) 0.5 MG tablet Take 0.5 mg by mouth at bedtime.    Yes [provider]  metoprolol tartrate (LOPRESSOR) 25 MG tablet Take 12.5 mg by mouth 2 (two) times daily.   Yes  [provider]  sodium chloride 1 G tablet Take 1 g by mouth every morning.    Yes [provider]  tamsulosin (FLOMAX) 0.4 MG CAPS capsule Take 0.4 mg by mouth daily after breakfast.    Yes [provider]    Family History Family History  Problem Relation Age of Onset  . Liver cancer Father   . Cancer Sister   . Cancer Sister   . Colon cancer Neg Hx     Social History Social History   Tobacco Use  . Smoking status: Never Smoker  . Smokeless tobacco: Never Used  Substance Use Topics  . Alcohol use: No    Alcohol/week: 0.0 oz  . Drug use: No     Allergies   Ampicillin and Metronidazole   Review of Systems Review of Systems  Unable to perform ROS: Mental status change  Constitutional: Positive for fatigue.  Neurological: Positive for weakness.     Physical Exam Updated Vital Signs BP 123/71 (BP Location: Right Arm)   Pulse 83   Temp 98.2 F (36.8 C) (Oral)   Resp 14   Ht 5\' 9"  (1.753 m)   Wt 83.9 kg (185 lb)   SpO2 98%   BMI 27.32 kg/m   Physical Exam  Constitutional: He appears well-developed and well-nourished. No distress.  HENT:  Head: Normocephalic and atraumatic.  Eyes: Conjunctivae are normal.  Neck: Neck supple.  Cardiovascular: Normal rate, regular rhythm and normal heart sounds. Exam reveals no friction rub.  No murmur heard. Pulmonary/Chest: Effort normal and breath sounds normal. No respiratory distress. He has no wheezes. He has no rales.  Abdominal: He exhibits no distension.  Musculoskeletal: He exhibits no edema.  Neurological: He is alert. He exhibits normal muscle tone.  Skin: Skin is warm. Capillary refill takes less than 2 seconds.  Psychiatric: He has a normal mood and affect.  Nursing note and vitals reviewed.   Neurological Exam:  Mental Status: Alert. Oriented to person only. Attention and concentration normal. Speech clear. Recent memory impaired Cranial Nerves: Visual fields grossly intact.  EOMI and PERRLA. No nystagmus noted. Facial sensation intact at forehead, maxillary cheek, and chin/mandible bilaterally. No facial asymmetry or weakness. Hearing grossly normal. Uvula is midline, and palate elevates symmetrically. Normal SCM and trapezius strength. Tongue midline without fasciculations. Motor: Muscle strength 5/5 in proximal and distal UE and LE bilaterally. No pronator drift. Muscle tone normal. Reflexes: 2+ and symmetrical in all four extremities.  Sensation: Intact to light touch in upper and lower extremities distally bilaterally.  Gait: Normal without ataxia. Coordination: Normal FTN bilaterally.     ED Treatments / Results  Labs (all labs ordered are listed, but only abnormal results are displayed) Labs Reviewed  COMPREHENSIVE METABOLIC PANEL - Abnormal; Notable for the following components:      Result Value   Sodium 132 (*)    Chloride 98 (*)    Glucose, Bld 120 (*)    Total Protein  5.9 (*)    All other components within normal limits  I-STAT CHEM 8, ED - Abnormal; Notable for the following components:   Sodium 134 (*)    Chloride 96 (*)    Glucose, Bld 122 (*)    All other components within normal limits  CBC WITH DIFFERENTIAL/PLATELET  ETHANOL  PROTIME-INR  URINALYSIS, ROUTINE W REFLEX MICROSCOPIC  I-STAT TROPONIN, ED    EKG  EKG Interpretation  Date/Time:  Thursday July 16 2017 09:56:04 EST Ventricular Rate:  79 PR Interval:  184 QRS Duration: 128 QT Interval:  392 QTC Calculation: 449 R Axis:   35 Text Interpretation:  Normal sinus rhythm Right bundle branch block Possible Inferior infarct , age undetermined Abnormal ECG No significant change since last tracing Confirmed by Shaune Pollack [phone]) on 07/16/2017 10:24:11 AM       Radiology Dg Chest 2 View  Result Date: 07/16/2017 CLINICAL DATA:  Weakness. EXAM: CHEST  2 VIEW COMPARISON:  Radiographs of March 31, 2016. FINDINGS: The heart size and mediastinal contours are within  normal limits. Both lungs are clear. No pneumothorax or pleural effusion is noted. The visualized skeletal structures are unremarkable. IMPRESSION: No active cardiopulmonary disease. Electronically Signed   By: Lupita Raider, M.D.   On: 07/16/2017 11:38   Ct Head Wo Contrast  Result Date: 07/16/2017 CLINICAL DATA:  TIA.  Speech abnormality EXAM: CT HEAD WITHOUT CONTRAST TECHNIQUE: Contiguous axial images were obtained from the base of the skull through the vertex without intravenous contrast. COMPARISON:  MRI 10/07/2014, CT 10/07/2014 FINDINGS: Brain: Moderate atrophy.  Negative for hydrocephalus. Chronic microvascular ischemic changes in the white matter. Chronic lacunar infarction left internal capsule unchanged. Negative for acute infarct, hemorrhage, or mass. No shift of the midline structures. Vascular: Negative for hyperdense vessel Skull: Negative Sinuses/Orbits: Paranasal sinuses clear. Bilateral cataract removal. Other: None IMPRESSION: No acute abnormality. Atrophy and chronic microvascular ischemia is stable from the prior study. Electronically Signed   By: Marlan Palau M.D.   On: 07/16/2017 11:52    Procedures Procedures (including critical care time)  Medications Ordered in ED Medications - No data to display   Initial Impression / Assessment and Plan / ED Course  I have reviewed the triage vital signs and the nursing notes.  Pertinent labs & imaging results that were available during my care of the patient were reviewed by me and considered in my medical decision making (see chart for details).     84 year old male with past medical history as above here with increasingly frequent episodes of what sounds like TIA.  His symptoms are now resolved.  CT head negative.  Of note, the patient is also had increasing confusion, hallucinations, and is having more difficult time taking care of himself at home.  Discussed the case with neurology.  Given the increasing severity and  duration, Dr. Wilford Corner will see the patient a.  Patient is nearly medically optimized at this time, but I feel he would also benefit from PT/OT evaluation for possible placement if family will allow.  Dr. Willette Pa has seen pt.  Had a long shared decision making discussion.  Patient does not desire admission and I feel this is reasonable given that he is already medically optimized.  Neurology has seen the patient as well.  Spot EEG obtained in ED and per Dr. Wilford Corner, can be followed up as outpatient as no seizure like activity currently. Current plan is for patient to return and then be discharged to family with home  health which has been set up.  Family in agreement. Case Management/SW has been consulted.  Final Clinical Impressions(s) / ED Diagnoses   Final diagnoses:  TIA (transient ischemic attack)  Dementia without behavioral disturbance, unspecified dementia type    ED Discharge Orders    None       Shaune Pollack, MD 07/16/17 2023

## 2017-07-16 NOTE — ED Notes (Signed)
Neurologist in room at this time with patient.

## 2017-07-16 NOTE — Consult Note (Signed)
ER consultation note   Wyatt Bambergeter A Claud NWG:[id number] DOB: 07/17/1928 DOA: 07/16/2017  PCP: [name], [name], MD  Patient coming from: Home I have personally briefly reviewed [facility]  Chief Complaint: Acute confusion and speech difficulty now resolved  I was asked to see this patient in consultation by Dr. Erma HeritageIsaacs from the emergency department   HPI: Wyatt [name] is a 84 y.o. male with medical history significant dementia, stroke, coronary artery disease status post coronary angioplasty with stent placement in 2001, atrial fibrillation on anticoagulation, inferior MI, hypertension, hyperlipidemia, and diverticulosis who presents with increasingly frequent episodes of acute onset of expressive aphasia.  This morning the patient was in his usual state of health when he woke up around 6 AM.  Between 7 and 730 he was drinking his coffee when he began to speak and not make any sense.  He became agitated at that time and asked for help.  He seemed to understand what was happening around him but could not speak clearly.  Over the frequent 2 hours his symptoms completely resolved.  Patient has had multiple previous episodes of this type of presentation however they have resolved over a shorter period of time.  These TIAs however have been increasing in frequency severity and duration recently.  The patient has had occasional visual hallucinations and more confusion at night he has had increasing difficulty getting around and taking care of himself at home.  He lives with his wife who is 462 years old and provides all of his care.  Has had no recent fevers or chills, no trauma, no nausea vomiting or diarrhea.  On presentation the patient is pleasantly demented and without any complaints.  Due to his dementia most of the history is obtained from the patient's wife, his daughter, and his daughter-in-law who is a speech therapist here at our facility.  I also obtained history  from the emergency department physician.  ED Course:  Each EKG shows normal sinus rhythm right bundle branch block possible inferior infarct per my review.  CT scan of the head shows chronic microvascular ischemic changes but no acute infarct per my evaluation.  Will be seen by neurology in the emergency department will likely need PT OT evaluation    Review of Systems: As per HPI otherwise complete remainder of review of systems negative.    Past Medical History:  Diagnosis Date  . Anxiety   . Atrial fibrillation (HCC)   . CAD (coronary artery disease)   . CVA (cerebral infarction)    while in [place]  . Diverticulosis   . Hyperlipemia   . Hypertension   . Inferior MI (HCC) 2001   stent to RCA-Dr. Juanda ChanceBrodie  . Lymphocytic colitis   . Personal history of colonic polyps 04/2009   SERRATED ADENOMA  . Stroke (HCC)   . Syncope and collapse 2014   in [place] prior to the stroke by 24 hours.    Past Surgical History:  Procedure Laterality Date  . CORONARY ANGIOPLASTY WITH STENT PLACEMENT  2001   Dr. Eden EmmsNishan cathed, Dr Juanda ChanceBrodie stented  . TOTAL KNEE ARTHROPLASTY       reports that  has never smoked. he has never used smokeless tobacco. He reports that he does not drink alcohol or use drugs.  Allergies  Allergen Reactions  . Ampicillin Other (See Comments)    REACTION: unspecified  . Metronidazole Other (See Comments)    Burning skin and hands    Family  History  Problem Relation Age of Onset  . Liver cancer Father   . Cancer Sister   . Cancer Sister   . Colon cancer Neg Hx      Prior to Admission medications   Medication Sig Start Date End Date Taking? Authorizing Provider  atorvastatin (LIPITOR) 10 MG tablet Take 1 tablet by mouth every morning. 03/30/16  Yes [provider]  ELIQUIS 5 MG TABS tablet TAKE 1 TABLET BY MOUTH TWICE DAILY 03/02/17  Yes Hilty, Lisette Abu, MD  L-Methylfolate-B12-B6-B2 (CEREFOLIN) 10-24-48-5 MG TABS Take 1 tablet by mouth daily. Patient  taking differently: Take 1 tablet by mouth every evening.  09/22/16  Yes Nilda Riggs, NP  lisinopril (PRINIVIL,ZESTRIL) 10 MG tablet Take 1 tablet (10 mg total) by mouth daily. Patient taking differently: Take 10 mg by mouth every morning.  07/14/17  Yes Hilty, Lisette Abu, MD  LORazepam (ATIVAN) 0.5 MG tablet Take 0.5 mg by mouth at bedtime.    Yes [provider]  metoprolol tartrate (LOPRESSOR) 25 MG tablet Take 12.5 mg by mouth 2 (two) times daily.   Yes [provider]  sodium chloride 1 G tablet Take 1 g by mouth every morning.    Yes [provider]  tamsulosin (FLOMAX) 0.4 MG CAPS capsule Take 0.4 mg by mouth daily after breakfast.    Yes [provider]    Physical Exam: Vitals:   07/16/17 1045 07/16/17 1100 07/16/17 1200 07/16/17 1300  BP: 130/66 (!) 78/48 (!) 147/73 116/69  Pulse: 78 70 74 70  Resp: 20 13 18 17   Temp:      TempSrc:      SpO2: 99% 99% 100% 100%  Weight:      Height:       .TCS Constitutional: NAD, calm, comfortable Vitals:   07/16/17 1045 07/16/17 1100 07/16/17 1200 07/16/17 1300  BP: 130/66 (!) 78/48 (!) 147/73 116/69  Pulse: 78 70 74 70  Resp: 20 13 18 17   Temp:      TempSrc:      SpO2: 99% 99% 100% 100%  Weight:      Height:       Eyes: PERRL, lids and conjunctivae normal ENMT: Mucous membranes are moist. Posterior pharynx clear of any exudate or lesions.Normal dentition.  Neck: normal, supple, no masses, no thyromegaly Respiratory: clear to auscultation bilaterally, no wheezing, no crackles. Normal respiratory effort. No accessory muscle use.  Cardiovascular: Regular rate and rhythm, no murmurs / rubs / gallops. No extremity edema. 2+ pedal pulses. No carotid bruits.  Abdomen: no tenderness, no masses palpated. No hepatosplenomegaly. Bowel sounds positive.  Musculoskeletal: no clubbing / cyanosis. No joint deformity upper and lower extremities. Good ROM, no contractures. Normal muscle tone.  Skin: no  rashes, lesions, ulcers. No induration Neurologic: CN 2-12 grossly intact. Sensation intact, DTR normal. Strength 5/5 in all 4.  Psychiatric: Normal judgment and insight. Alert and oriented x 3. Normal mood.     Labs on Admission: I have personally reviewed following labs and imaging studies  CBC: Recent Labs  Lab 07/16/17 1014 07/16/17 1059  WBC  --  8.2  NEUTROABS  --  5.8  HGB 15.0 15.4  HCT 44.0 43.8  MCV  --  96.1  PLT  --  177   Basic Metabolic Panel: Recent Labs  Lab 07/16/17 1014 07/16/17 1059  NA 134* 132*  K 4.1 4.0  CL 96* 98*  CO2  --  24  GLUCOSE 122* 120*  BUN  13 12  CREATININE 1.00 1.02  CALCIUM  --  9.0   GFR: Estimated Creatinine Clearance: 51 mL/min (by C-G formula based on SCr of 1.02 mg/dL). Liver Function Tests: Recent Labs  Lab 07/16/17 1059  AST 27  ALT 21  ALKPHOS 56  BILITOT 0.8  PROT 5.9*  ALBUMIN 3.7   No results for input(s): LIPASE, AMYLASE in the last 168 hours. No results for input(s): AMMONIA in the last 168 hours. Coagulation Profile: Recent Labs  Lab 07/16/17 1059  INR 1.16   Cardiac Enzymes: No results for input(s): CKTOTAL, CKMB, CKMBINDEX, TROPONINI in the last 168 hours. BNP (last 3 results) No results for input(s): PROBNP in the last 8760 hours. HbA1C: No results for input(s): HGBA1C in the last 72 hours. CBG: No results for input(s): GLUCAP in the last 168 hours. Lipid Profile: No results for input(s): CHOL, HDL, LDLCALC, TRIG, CHOLHDL, LDLDIRECT in the last 72 hours. Thyroid Function Tests: No results for input(s): TSH, T4TOTAL, FREET4, T3FREE, THYROIDAB in the last 72 hours. Anemia Panel: No results for input(s): VITAMINB12, FOLATE, FERRITIN, TIBC, IRON, RETICCTPCT in the last 72 hours. Urine analysis:    Component Value Date/Time   COLORURINE YELLOW 07/16/2017 1206   APPEARANCEUR CLEAR 07/16/2017 1206   LABSPEC 1.011 07/16/2017 1206   PHURINE 6.0 07/16/2017 1206   GLUCOSEU NEGATIVE 07/16/2017 1206    HGBUR NEGATIVE 07/16/2017 1206   BILIRUBINUR NEGATIVE 07/16/2017 1206   KETONESUR NEGATIVE 07/16/2017 1206   PROTEINUR NEGATIVE 07/16/2017 1206   UROBILINOGEN 0.2 10/07/2014 1344   NITRITE NEGATIVE 07/16/2017 1206   LEUKOCYTESUR NEGATIVE 07/16/2017 1206    Radiological Exams on Admission: Dg Chest 2 View  Result Date: 07/16/2017 CLINICAL DATA:  Weakness. EXAM: CHEST  2 VIEW COMPARISON:  Radiographs of March 31, 2016. FINDINGS: The heart size and mediastinal contours are within normal limits. Both lungs are clear. No pneumothorax or pleural effusion is noted. The visualized skeletal structures are unremarkable. IMPRESSION: No active cardiopulmonary disease. Electronically Signed   By: Lupita Raider, M.D.   On: 07/16/2017 11:38   Ct Head Wo Contrast  Result Date: 07/16/2017 CLINICAL DATA:  TIA.  Speech abnormality EXAM: CT HEAD WITHOUT CONTRAST TECHNIQUE: Contiguous axial images were obtained from the base of the skull through the vertex without intravenous contrast. COMPARISON:  MRI 10/07/2014, CT 10/07/2014 FINDINGS: Brain: Moderate atrophy.  Negative for hydrocephalus. Chronic microvascular ischemic changes in the white matter. Chronic lacunar infarction left internal capsule unchanged. Negative for acute infarct, hemorrhage, or mass. No shift of the midline structures. Vascular: Negative for hyperdense vessel Skull: Negative Sinuses/Orbits: Paranasal sinuses clear. Bilateral cataract removal. Other: None IMPRESSION: No acute abnormality. Atrophy and chronic microvascular ischemia is stable from the prior study. Electronically Signed   By: Marlan Palau M.D.   On: 07/16/2017 11:52    EKG: Independently reviewed.  Shows normal sinus rhythm with right bundle branch block  Assessment/Plan 1.  TIAs with aphasia: Patient already fully anticoagulated.  Family does not wish for admission to the hospital.  They hope that he can do well at home because they feel that he gets so confused at  night it would set him back a great deal.  Given his condition he and the fact that he has tremendous support at home I feel the patient can be safely discharged home with an EEG done in the near future.  I have spoken with Dr. Erma Heritage who agrees with above.  Patient remains a DNR.  I did speak at  length with the patient's wife, his daughter, and his daughter-in-law regarding getting some assistance at home especially in the evening for the patient's wife.  She seems to be fatiguing at night when she has to cook dinner.  I suggested that they obtain assistance with an aide perhaps 3 or 4 hours a day 5-7 days a week.  Patient's wife is open to considering that possibility.  I also mentioned that in the near future hospice care may be of benefit to them as the patient continues to become more and more confused.  He states that the patient does not wander and never gets violent although he does occasionally get confused.  He remains very pleasant.  Even in the emergency department when I pressed him on his dimension pointed out that he could not remember certain things he just simply smiled and said he knew what they were.  At this point patient and family wished to discharge home and I agree with that plan.        Lahoma Crocker MD FACP [facility] Pager [phone]  If 7PM-7AM, please contact night-coverage [email or web address] Password [email or web address]  07/16/2017, 1:23 PM

## 2017-07-16 NOTE — Consult Note (Addendum)
NEURO HOSPITALIST CONSULT NOTE   Requestig physician: Dr. Erma Heritage, C   Reason for Consult: Frequent speech arrest with prolonged speech arrest today   History obtained from: Patient's family  HPI:                                                                                                                                          Wyatt [name] is an 84 y.o. male with anxiety, atrial fibrillation--on Eliquis, history of CVA, hyperlipidemia, hypertension.  Per family over the past few years they have noticed a steady decline in his memory.  In 2017 he scored 25/30 on his Mini-Mental status exam however in the past few years they have noticed that he significantly has declined.  In addition over the past few years they have noticed that he has been having episodes in which she is having a hard time either getting his words out or understanding people.  Often times these episodes last about 20 minutes however today it lasted for almost an hour.  Due to the prolonged duration and fear of stroke patient was brought to the ED.  In addition at this point in time the wife and husband is having a hard time caring for themselves as he is memory is declining and she has decreased mobility.  Currently patient's speech has resolved however it is very noticeable during the conversation that he has a very short memory span.  Past Medical History:  Diagnosis Date  . Anxiety   . Atrial fibrillation (HCC)   . CAD (coronary artery disease)   . CVA (cerebral infarction)    while in [place]  . Diverticulosis   . Hyperlipemia   . Hypertension   . Inferior MI (HCC) 2001   stent to RCA-Dr. Juanda Chance  . Lymphocytic colitis   . Personal history of colonic polyps 04/2009   SERRATED ADENOMA  . Stroke (HCC)   . Syncope and collapse 2014   in [place] prior to the stroke by 24 hours.    Past Surgical History:  Procedure Laterality Date  . CORONARY ANGIOPLASTY WITH STENT PLACEMENT  2001   Dr.  Eden Emms cathed, Dr Juanda Chance stented  . TOTAL KNEE ARTHROPLASTY      Family History  Problem Relation Age of Onset  . Liver cancer Father   . Cancer Sister   . Cancer Sister   . Colon cancer Neg Hx     Social History:  reports that  has never smoked. he has never used smokeless tobacco. He reports that he does not drink alcohol or use drugs.  Allergies  Allergen Reactions  . Ampicillin Other (See Comments)    REACTION: unspecified  . Metronidazole Other (See Comments)    Burning skin and hands    MEDICATIONS:  No current facility-administered medications for this encounter.    Current Outpatient Medications  Medication Sig Dispense Refill  . atorvastatin (LIPITOR) 10 MG tablet Take 1 tablet by mouth every morning.  2  . ELIQUIS 5 MG TABS tablet TAKE 1 TABLET BY MOUTH TWICE DAILY 180 tablet 1  . L-Methylfolate-B12-B6-B2 (CEREFOLIN) 10-24-48-5 MG TABS Take 1 tablet by mouth daily. (Patient taking differently: Take 1 tablet by mouth every evening. ) 90 each 3  . lisinopril (PRINIVIL,ZESTRIL) 10 MG tablet Take 1 tablet (10 mg total) by mouth daily. (Patient taking differently: Take 10 mg by mouth every morning. ) 90 tablet 3  . LORazepam (ATIVAN) 0.5 MG tablet Take 0.5 mg by mouth at bedtime.     . metoprolol tartrate (LOPRESSOR) 25 MG tablet Take 12.5 mg by mouth 2 (two) times daily.    . sodium chloride 1 G tablet Take 1 g by mouth every morning.     . tamsulosin (FLOMAX) 0.4 MG CAPS capsule Take 0.4 mg by mouth daily after breakfast.         ROS:                                                                                                                                       History obtained from Family  General ROS: negative for - chills, fatigue, fever, night sweats, weight gain or weight loss Psychological ROS: Positive for - behavioral disorder, hallucinations,  memory difficulties, mood swings  Ophthalmic ROS: negative for - blurry vision, double vision, eye pain or loss of vision ENT ROS: negative for - epistaxis, nasal discharge, oral lesions, sore throat, tinnitus or vertigo Allergy and Immunology ROS: negative for - hives or itchy/watery eyes Hematological and Lymphatic ROS: negative for - bleeding problems, bruising or swollen lymph nodes Endocrine ROS: negative for - galactorrhea, hair pattern changes, polydipsia/polyuria or temperature intolerance Respiratory ROS: negative for - cough, hemoptysis, shortness of breath or wheezing Cardiovascular ROS: negative for - chest pain, dyspnea on exertion, edema or irregular heartbeat Gastrointestinal ROS: negative for - abdominal pain, diarrhea, hematemesis, nausea/vomiting or stool incontinence Genito-Urinary ROS: negative for - dysuria, hematuria, incontinence or urinary frequency/urgency Musculoskeletal ROS: negative for - joint swelling or muscular weakness Neurological ROS: as noted in HPI Dermatological ROS: negative for rash and skin lesion changes   Blood pressure 116/69, pulse 70, temperature 98.2 F (36.8 C), temperature source Oral, resp. rate 17, height 5\' 9"  (1.753 m), weight 83.9 kg (185 lb), SpO2 100 %.   General Examination:  Physical Exam  HEENT-  Normocephalic, no lesions, without obvious abnormality.  Normal external eye and conjunctiva.   Cardiovascular- S1-S2 audible, pulses palpable throughout   Lungs-no rhonchi or wheezing noted, no excessive working breathing.  Saturations within normal limits Abdomen- All 4 quadrants palpated and nontender Extremities- Warm, dry and intact Musculoskeletal-no joint tenderness, deformity or swelling Skin-warm and dry, no hyperpigmentation, vitiligo, or suspicious lesions  Neurological Examination Mental Status: Patient is alert, he is  aware that he is at the hospital, he knows the year, but when asked her questions he quickly diverts and says I do not know and I do not care.  Patient is able to follow commands without any difficulties.  It is noted though that although initially he stated he recognized that he had problems with speech he quickly forgot and remained adamant he had no problem with his speech this morning Cranial Nerves: II:  Visual fields grossly normal,  III,IV, VI: ptosis not present, extra-ocular motions intact bilaterally pupils equal, round, reactive to light and accommodation V,VII: smile symmetric, facial light touch sensation normal bilaterally VIII: hearing normal bilaterally IX,X: uvula rises symmetrically XI: bilateral shoulder shrug XII: midline tongue extension Motor: Right : Upper extremity   5/5    Left:     Upper extremity   5/5  Lower extremity   5/5     Lower extremity   5/5 Tone and bulk:normal tone throughout; no atrophy noted Sensory: Pinprick and light touch intact throughout, bilaterally Deep Tendon Reflexes: 2+ and symmetric throughout Plantars: Right: downgoing   Left: downgoing Cerebellar: normal finger-to-nose, and normal heel-to-shin test Gait: Not tested   Lab Results: Basic Metabolic Panel: Recent Labs  Lab 07/16/17 1014 07/16/17 1059  NA 134* 132*  K 4.1 4.0  CL 96* 98*  CO2  --  24  GLUCOSE 122* 120*  BUN 13 12  CREATININE 1.00 1.02  CALCIUM  --  9.0    CBC: Recent Labs  Lab 07/16/17 1014 07/16/17 1059  WBC  --  8.2  NEUTROABS  --  5.8  HGB 15.0 15.4  HCT 44.0 43.8  MCV  --  96.1  PLT  --  177    Cardiac Enzymes: No results for input(s): CKTOTAL, CKMB, CKMBINDEX, TROPONINI in the last 168 hours.  Lipid Panel: No results for input(s): CHOL, TRIG, HDL, CHOLHDL, VLDL, LDLCALC in the last 168 hours.  Imaging: Dg Chest 2 View  Result Date: 07/16/2017 CLINICAL DATA:  Weakness. EXAM: CHEST  2 VIEW COMPARISON:  Radiographs of March 31, 2016.  FINDINGS: The heart size and mediastinal contours are within normal limits. Both lungs are clear. No pneumothorax or pleural effusion is noted. The visualized skeletal structures are unremarkable. IMPRESSION: No active cardiopulmonary disease. Electronically Signed   By: Lupita Raider, M.D.   On: 07/16/2017 11:38   Ct Head Wo Contrast  Result Date: 07/16/2017 CLINICAL DATA:  TIA.  Speech abnormality EXAM: CT HEAD WITHOUT CONTRAST TECHNIQUE: Contiguous axial images were obtained from the base of the skull through the vertex without intravenous contrast. COMPARISON:  MRI 10/07/2014, CT 10/07/2014 FINDINGS: Brain: Moderate atrophy.  Negative for hydrocephalus. Chronic microvascular ischemic changes in the white matter. Chronic lacunar infarction left internal capsule unchanged. Negative for acute infarct, hemorrhage, or mass. No shift of the midline structures. Vascular: Negative for hyperdense vessel Skull: Negative Sinuses/Orbits: Paranasal sinuses clear. Bilateral cataract removal. Other: None IMPRESSION: No acute abnormality. Atrophy and chronic microvascular ischemia is stable from the prior study. Electronically Signed  By: Marlan Palau M.D.   On: 07/16/2017 11:52    Assessment and plan per attending neurologist  Felicie Morn PA-C [facility] [phone]  07/16/2017, 1:40 PM  Attending addendum Patient seen and examined with Andres Labrum PA-C Agree with the history and physical documented above. I have personally reviewed the CAT scan of the head done today. No acute changes.  Atrophy and chronic microvascular change stable from prior study. MRI and MRA of the head was done in 2016.  I am unable to access the images but the report was available through the epic electronic medical record that reported no evidence of acute infarction at that time, no hemorrhage or mass lesion or hydrocephalus.  Chronic right cerebellar and left basal ganglia lacunar infarcts.  MRA of the head  reported diminutive left A1 with dominant right A1 segment of the anterior cerebral artery.  No intracranial stenosis of the MCA or PCA vessels.  Patent basilar with codominant vertebrals.  Assessment/Plan: 84 year old male with progressive decline in memory and increasing episodes of speech difficulty including difficulty understanding, expressing, and speech arrest.  Patient presented to the hospital today secondary to prolonged speech difficulties and notable need for further assistance in the house as he has significant dementia and the wife is unable to ambulate well.  It was discussed with the family that some of the speech issues could possibly be strokes versus possible seizure activity. I am unable to access prior brain MRI images and do not know if there is any component of underlying amyloid angiopathy which might be contributing to his rapidly progressive dementia.  Impression -Evaluate for TIA/stroke -Possibly contribution from rapidly progressive dementia - needs further investigation as outpatient - Evaluate for underlying seizure activity   Recommend --HgbA1c, fasting lipid panel --MRI/MRA of the brain without contrast --PT consult, OT consult, Speech consult.  Also might need social work consult. --Echocardiogram --80 mg of Atorvistatin --Prophylactic therapy-Antiplatelet med: Continue Eliquis --Risk factor modification --Telemetry monitoring --Frequent neuro checks --NPO until passes stroke swallow screen --EEG --Check B12, TSH. --Consider Depakote if suspicion is high for underlying seizure activity. --Consider using Seroquel very low-dose if QTC permits as needed for agitation.  Avoid Haldol and benzodiazepines as much as possible.  Please page stroke NP  Or  PA  Or MD from 8am -4 pm  as this patient from this time will be  followed by the stroke.   You can look them up on [email or web address]  Password [email or web address]   -- Milon Dikes, MD [facility] Pager:  [phone] If 7pm to 7am, please call on call as listed on AMION.

## 2017-07-16 NOTE — ED Notes (Signed)
Patient out for EEG.

## 2017-07-16 NOTE — Telephone Encounter (Signed)
Pt wife(on DPR) has called stating that pt is having lack of speech and his speech is gurggled which she feels is a TIA symptom.  She was advised to take pt to ED, she stated she did not want to do that because it is something that comes for 30 mins and clears up.  She is asking for a call to discuss.

## 2017-07-16 NOTE — Procedures (Signed)
  [facility] Chrisanne Loose A. Gerilyn Pilgrimoonquah, MD     [email or web address]           HISTORY: This is a 84 year old male who presents with current episodes of confusion and altered mental status.  The study is being done for further evaluation of the spells if there are due to nonconvulsive seizures.  MEDICATIONS: Scheduled Meds: Continuous Infusions: PRN Meds:.  Prior to Admission medications   Medication Sig Start Date End Date Taking? Authorizing Provider  atorvastatin (LIPITOR) 10 MG tablet Take 1 tablet by mouth every morning. 03/30/16  Yes [provider]  ELIQUIS 5 MG TABS tablet TAKE 1 TABLET BY MOUTH TWICE DAILY 03/02/17  Yes Hilty, Lisette AbuKenneth C, MD  L-Methylfolate-B12-B6-B2 (CEREFOLIN) 10-24-48-5 MG TABS Take 1 tablet by mouth daily. Patient taking differently: Take 1 tablet by mouth every evening.  09/22/16  Yes Nilda RiggsMartin, Nancy Carolyn, NP  lisinopril (PRINIVIL,ZESTRIL) 10 MG tablet Take 1 tablet (10 mg total) by mouth daily. Patient taking differently: Take 10 mg by mouth every morning.  07/14/17  Yes Hilty, Lisette AbuKenneth C, MD  LORazepam (ATIVAN) 0.5 MG tablet Take 0.5 mg by mouth at bedtime.    Yes [provider]  metoprolol tartrate (LOPRESSOR) 25 MG tablet Take 12.5 mg by mouth 2 (two) times daily.   Yes [provider]  sodium chloride 1 G tablet Take 1 g by mouth every morning.    Yes [provider]  tamsulosin (FLOMAX) 0.4 MG CAPS capsule Take 0.4 mg by mouth daily after breakfast.    Yes [provider]      ANALYSIS: A 16 channel recording using standard 10 20 measurements is conducted for 21 minutes.   There is a well-formed posterior dominant rhythm of nine hertz which attenuates with eye opening therapy.  There is beta activity observed in the frontal areas.  Awake and drowsy activities observed.  Photic stimulation and hyperventilation are not conducted.  There is no focal or lateralized slowing.  There is no epileptiform activity is  observed.   IMPRESSION:   This is a normal recording of awake and drowsy states.      Thom Ollinger A. Gerilyn Pilgrimoonquah, M.D.  Diplomate, Biomedical engineerAmerican Board of Psychiatry and Neurology ( Neurology).

## 2017-07-16 NOTE — Discharge Planning (Signed)
EDCM has recommended that this patient have Home Health Services but pt declines at this time. I have discussed the benefits of home health services as wll as the risks of not having home health services with pt. [facility] that if he changed his mind, his primary care physician can order home health from office.The patient verbalizes understanding. Pt and family given resources for private care agencies as requested.  No further EDCM needs identified at this time.

## 2017-07-16 NOTE — Telephone Encounter (Signed)
Spoke with patient's wife and advised her to take patient to the ED right away. She stated he has had these episodes in the past, approximately every two months. She stated it happened this morning but "has already cleared up". She denied he had or has facial drooping, arm/leg weakness, difficulty walking or other sypmtoms of any type.  She stated at this time, they are eating breakfast. This RN advised that he could be having TIA or stroke symptoms and strongly advised again [facility].  Patient's wife agreed and stated [facility].  This RN will follow up later today.

## 2017-07-16 NOTE — ED Triage Notes (Signed)
Brought to ED by wife who reports pt had an hour of aphagia. Hx of same. Speech normal now. Pt is alert and oriented now. Per wife, she was told to come to ED by neurology.

## 2017-08-06 DIAGNOSIS — I10 Essential (primary) hypertension: Secondary | ICD-10-CM | POA: Diagnosis not present

## 2017-08-06 DIAGNOSIS — Z8673 Personal history of transient ischemic attack (TIA), and cerebral infarction without residual deficits: Secondary | ICD-10-CM | POA: Diagnosis not present

## 2017-08-06 DIAGNOSIS — E785 Hyperlipidemia, unspecified: Secondary | ICD-10-CM | POA: Diagnosis not present

## 2017-08-06 DIAGNOSIS — F0390 Unspecified dementia without behavioral disturbance: Secondary | ICD-10-CM | POA: Diagnosis not present

## 2017-08-10 DIAGNOSIS — R350 Frequency of micturition: Secondary | ICD-10-CM | POA: Diagnosis not present

## 2017-08-10 DIAGNOSIS — N401 Enlarged prostate with lower urinary tract symptoms: Secondary | ICD-10-CM | POA: Diagnosis not present

## 2017-08-20 ENCOUNTER — Ambulatory Visit: Payer: Medicare Other | Admitting: Neurology

## 2017-10-04 ENCOUNTER — Other Ambulatory Visit: Payer: Self-pay | Admitting: Nurse Practitioner

## 2017-10-05 ENCOUNTER — Other Ambulatory Visit: Payer: Self-pay | Admitting: Nurse Practitioner

## 2017-10-20 DIAGNOSIS — B351 Tinea unguium: Secondary | ICD-10-CM | POA: Diagnosis not present

## 2017-10-20 DIAGNOSIS — L602 Onychogryphosis: Secondary | ICD-10-CM | POA: Diagnosis not present

## 2017-10-20 DIAGNOSIS — M2042 Other hammer toe(s) (acquired), left foot: Secondary | ICD-10-CM | POA: Diagnosis not present

## 2017-10-20 DIAGNOSIS — M2041 Other hammer toe(s) (acquired), right foot: Secondary | ICD-10-CM | POA: Diagnosis not present

## 2017-10-20 DIAGNOSIS — L851 Acquired keratosis [keratoderma] palmaris et plantaris: Secondary | ICD-10-CM | POA: Diagnosis not present

## 2017-10-20 DIAGNOSIS — Q828 Other specified congenital malformations of skin: Secondary | ICD-10-CM | POA: Diagnosis not present

## 2018-02-01 DIAGNOSIS — I482 Chronic atrial fibrillation: Secondary | ICD-10-CM | POA: Diagnosis not present

## 2018-02-01 DIAGNOSIS — Z7901 Long term (current) use of anticoagulants: Secondary | ICD-10-CM | POA: Diagnosis not present

## 2018-02-01 DIAGNOSIS — I10 Essential (primary) hypertension: Secondary | ICD-10-CM | POA: Diagnosis not present

## 2018-02-01 DIAGNOSIS — G301 Alzheimer's disease with late onset: Secondary | ICD-10-CM | POA: Diagnosis not present

## 2018-02-01 DIAGNOSIS — E871 Hypo-osmolality and hyponatremia: Secondary | ICD-10-CM | POA: Diagnosis not present

## 2018-02-01 DIAGNOSIS — F0281 Dementia in other diseases classified elsewhere with behavioral disturbance: Secondary | ICD-10-CM | POA: Diagnosis not present

## 2018-02-18 DIAGNOSIS — H35373 Puckering of macula, bilateral: Secondary | ICD-10-CM | POA: Diagnosis not present

## 2018-02-19 DIAGNOSIS — Z23 Encounter for immunization: Secondary | ICD-10-CM | POA: Diagnosis not present

## 2018-03-10 ENCOUNTER — Encounter (HOSPITAL_COMMUNITY): Payer: Self-pay | Admitting: Internal Medicine

## 2018-03-23 ENCOUNTER — Ambulatory Visit (HOSPITAL_COMMUNITY): Payer: Medicare Other | Attending: Cardiovascular Disease

## 2018-03-23 ENCOUNTER — Other Ambulatory Visit: Payer: Self-pay

## 2018-03-23 DIAGNOSIS — I350 Nonrheumatic aortic (valve) stenosis: Secondary | ICD-10-CM | POA: Diagnosis not present

## 2018-04-26 DIAGNOSIS — L602 Onychogryphosis: Secondary | ICD-10-CM | POA: Diagnosis not present

## 2018-04-26 DIAGNOSIS — B351 Tinea unguium: Secondary | ICD-10-CM | POA: Diagnosis not present

## 2018-04-29 DIAGNOSIS — I4819 Other persistent atrial fibrillation: Secondary | ICD-10-CM | POA: Diagnosis not present

## 2018-04-29 DIAGNOSIS — E785 Hyperlipidemia, unspecified: Secondary | ICD-10-CM | POA: Diagnosis not present

## 2018-04-29 DIAGNOSIS — I10 Essential (primary) hypertension: Secondary | ICD-10-CM | POA: Diagnosis not present

## 2018-04-29 DIAGNOSIS — Z0000 Encounter for general adult medical examination without abnormal findings: Secondary | ICD-10-CM | POA: Diagnosis not present

## 2018-04-29 DIAGNOSIS — F0390 Unspecified dementia without behavioral disturbance: Secondary | ICD-10-CM | POA: Diagnosis not present

## 2018-04-29 DIAGNOSIS — R350 Frequency of micturition: Secondary | ICD-10-CM | POA: Diagnosis not present

## 2018-05-04 IMAGING — CT CT HEAD W/O CM
3 series · 15 of 47 positions shown, 18 images · non-contrast
Comparison: MRI 10/07/2014, CT 10/07/2014

CLINICAL DATA: TIA.  Speech abnormality

EXAM:
CT HEAD WITHOUT CONTRAST
TECHNIQUE: Contiguous axial images were obtained from the base of the skull
through the vertex without intravenous contrast.

[Series 3: head 5.0 h30s · axial · 0.47mm/px · z∈[-44,+86]mm · 9 of 32 slices shown, 12 images]
[im 3/32  brain]
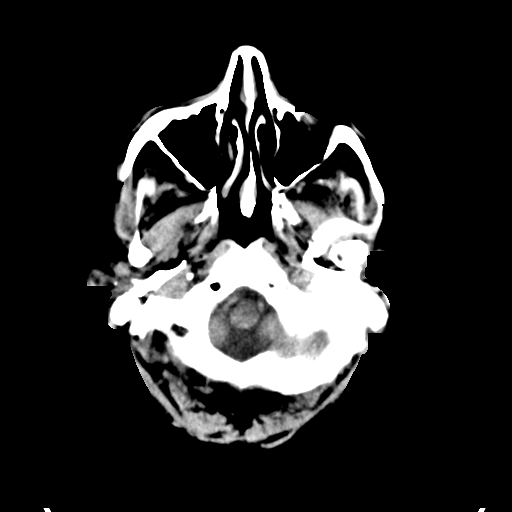
[im 3/32  bone]
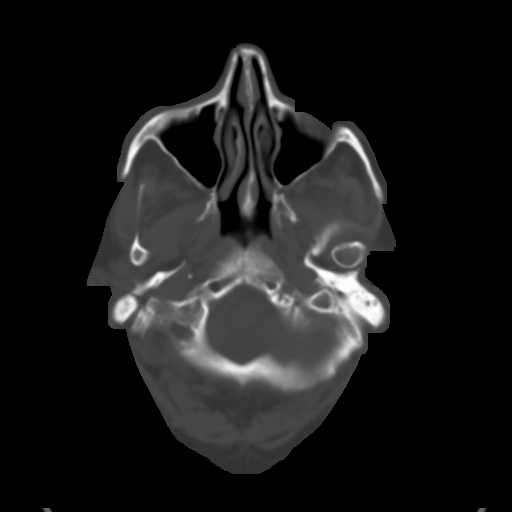
[im 6/32  brain]
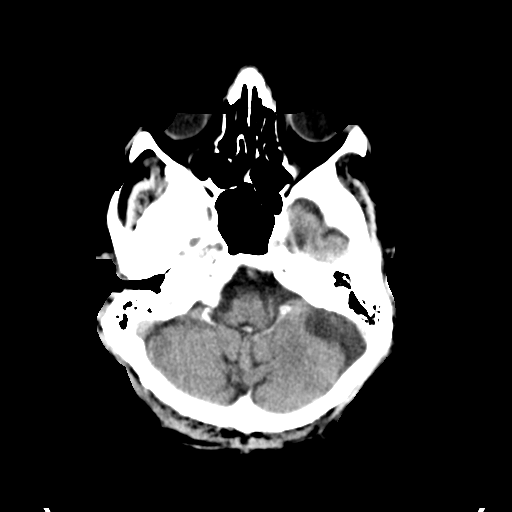
[im 9/32  brain]
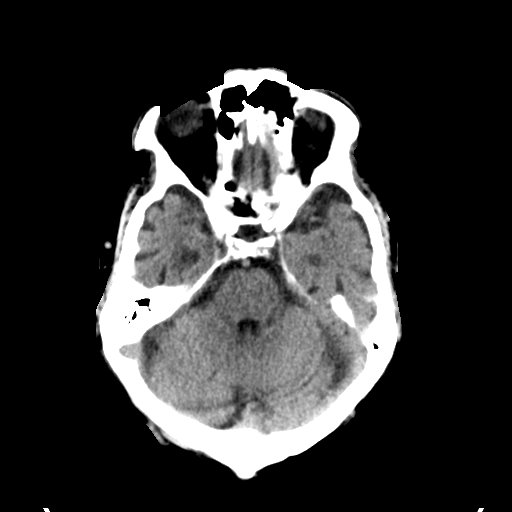
[im 12/32  brain]
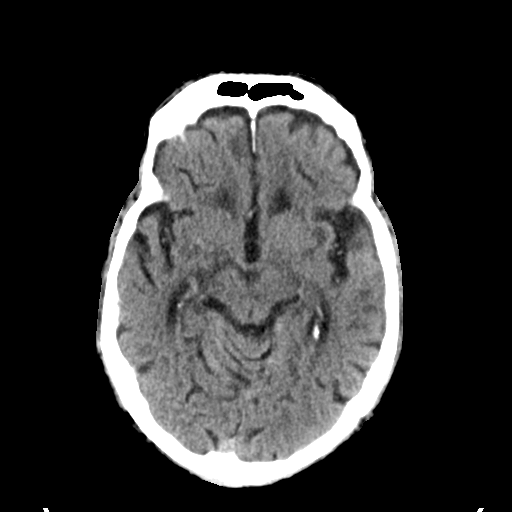
[im 17/32  brain]
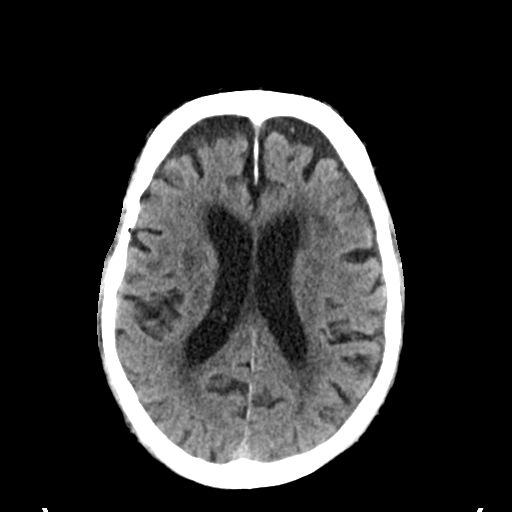
[im 17/32  bone]
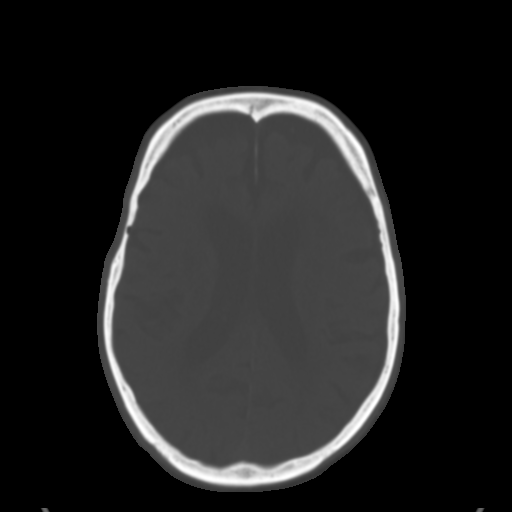
[im 20/32  brain]
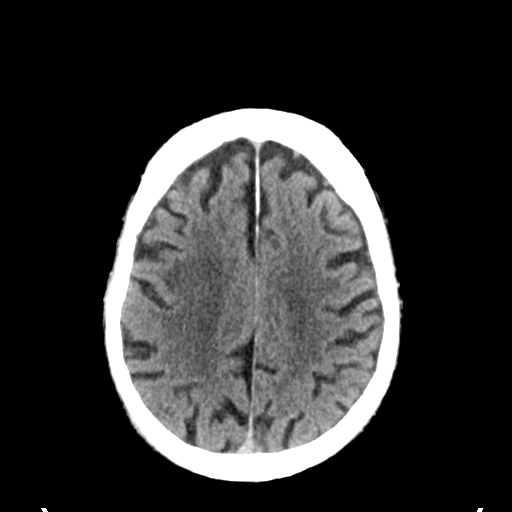
[im 23/32  brain]
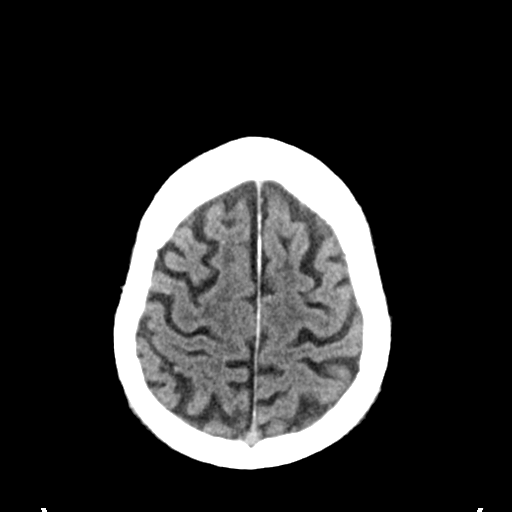
[im 26/32  brain]
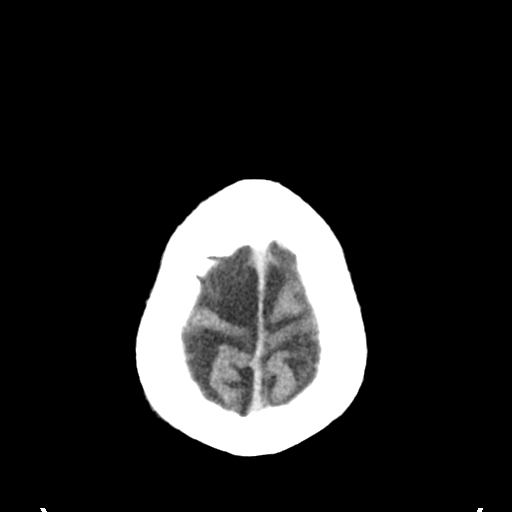
[im 29/32  brain]
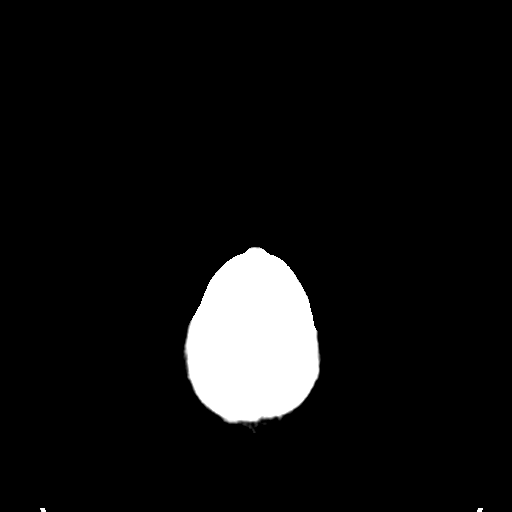
[im 29/32  bone]
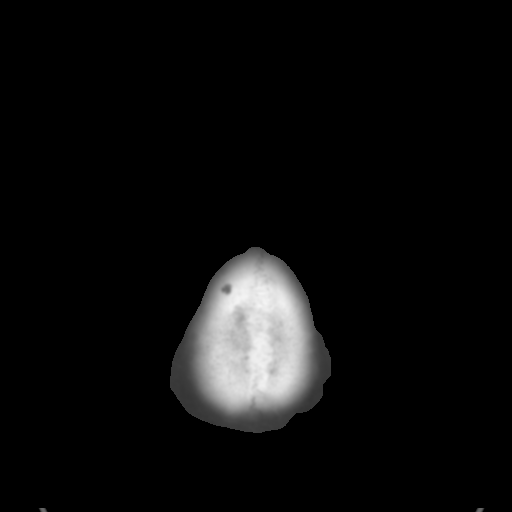

[Series 5: head 3.0 mpr cor · coronal · 0.33mm/px · 3 of 67 slices shown]
[im 23/67  brain]
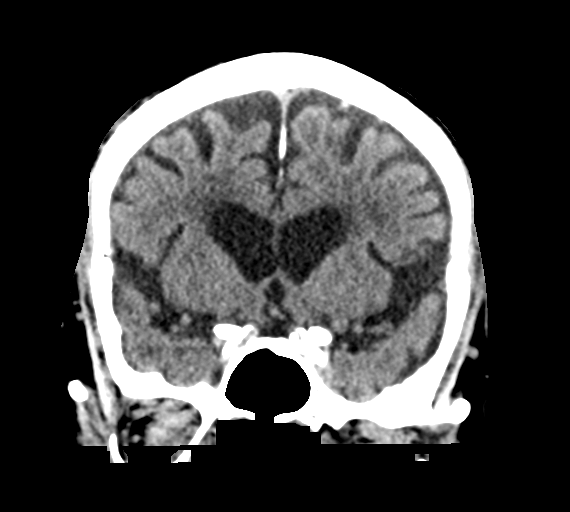
[im 30/67  brain]
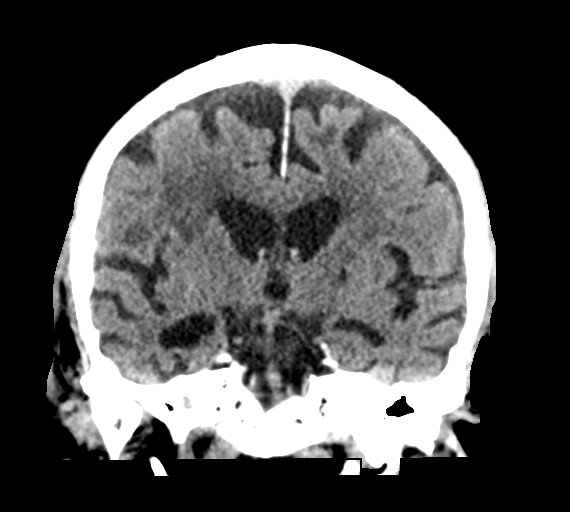
[im 37/67  brain]
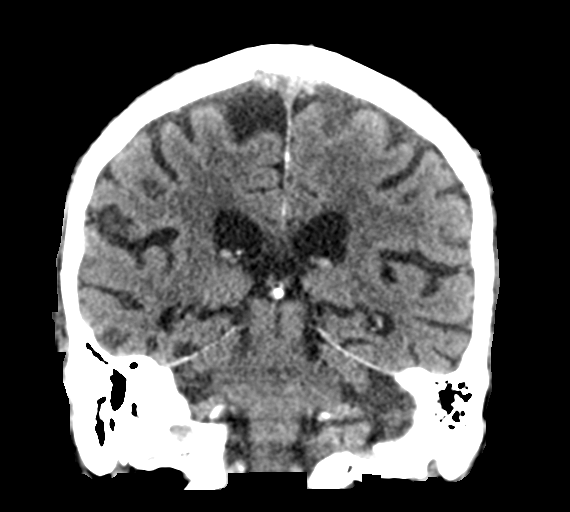

[Series 6: head 3.0 mpr sag · sagittal · 0.34mm/px · 3 of 63 slices shown]
[im 21/63  brain]
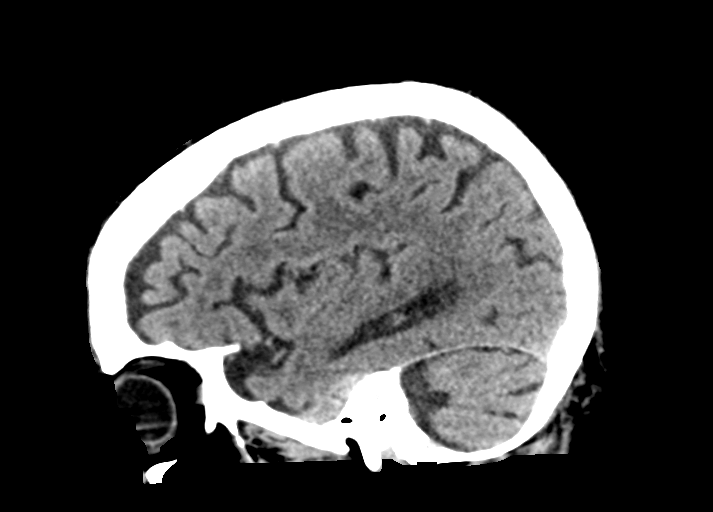
[im 32/63  brain]
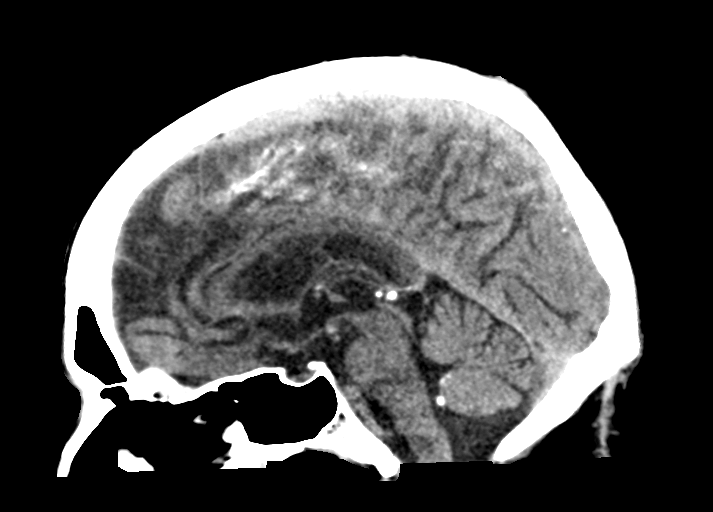
[im 42/63  brain]
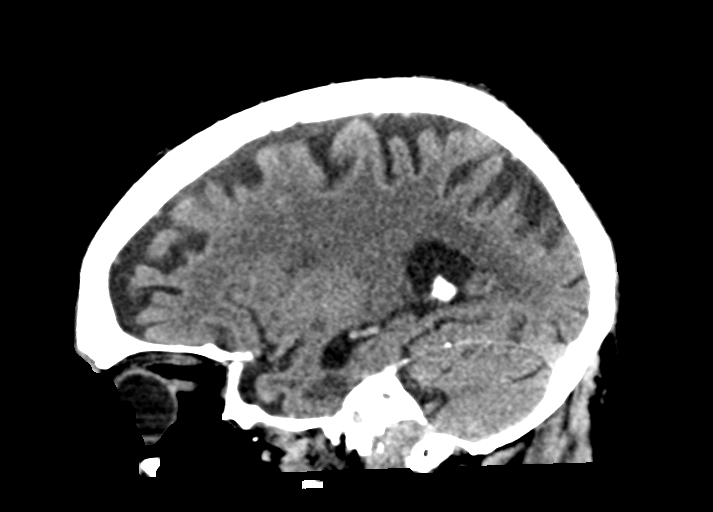

[15 of 47 positions shown; findings below may reference images not displayed]

FINDINGS: Brain: Moderate atrophy.  Negative for hydrocephalus.

Chronic microvascular ischemic changes in the white matter. Chronic
lacunar infarction left internal capsule unchanged. Negative for
acute infarct, hemorrhage, or mass. No shift of the midline
structures.

Vascular: Negative for hyperdense vessel

Skull: Negative

Sinuses/Orbits: Paranasal sinuses clear. Bilateral cataract removal.

Other: None
IMPRESSION: No acute abnormality. Atrophy and chronic microvascular ischemia is
stable from the prior study.

## 2018-05-04 IMAGING — DX DG CHEST 2V
3 series · 3 of 3 positions shown · non-contrast
Comparison: Radiographs March 31, 2016.

CLINICAL DATA: Weakness.

EXAM:
CHEST  2 VIEW

[chest lat (1 of 2)]
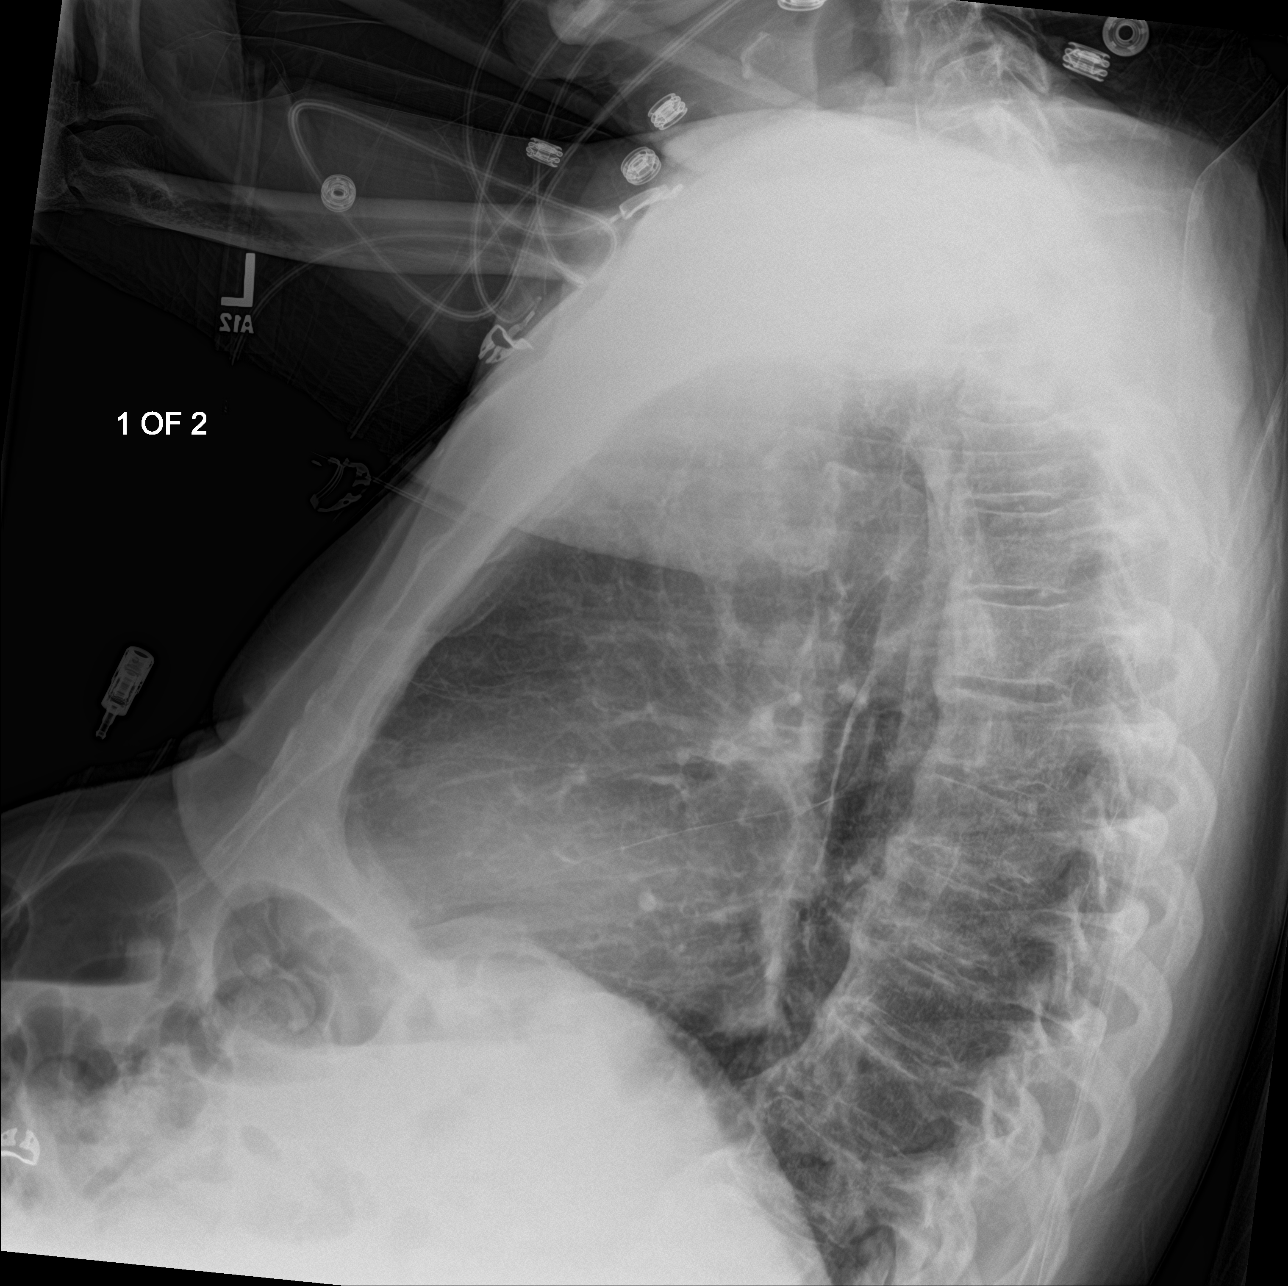

[chest ap]
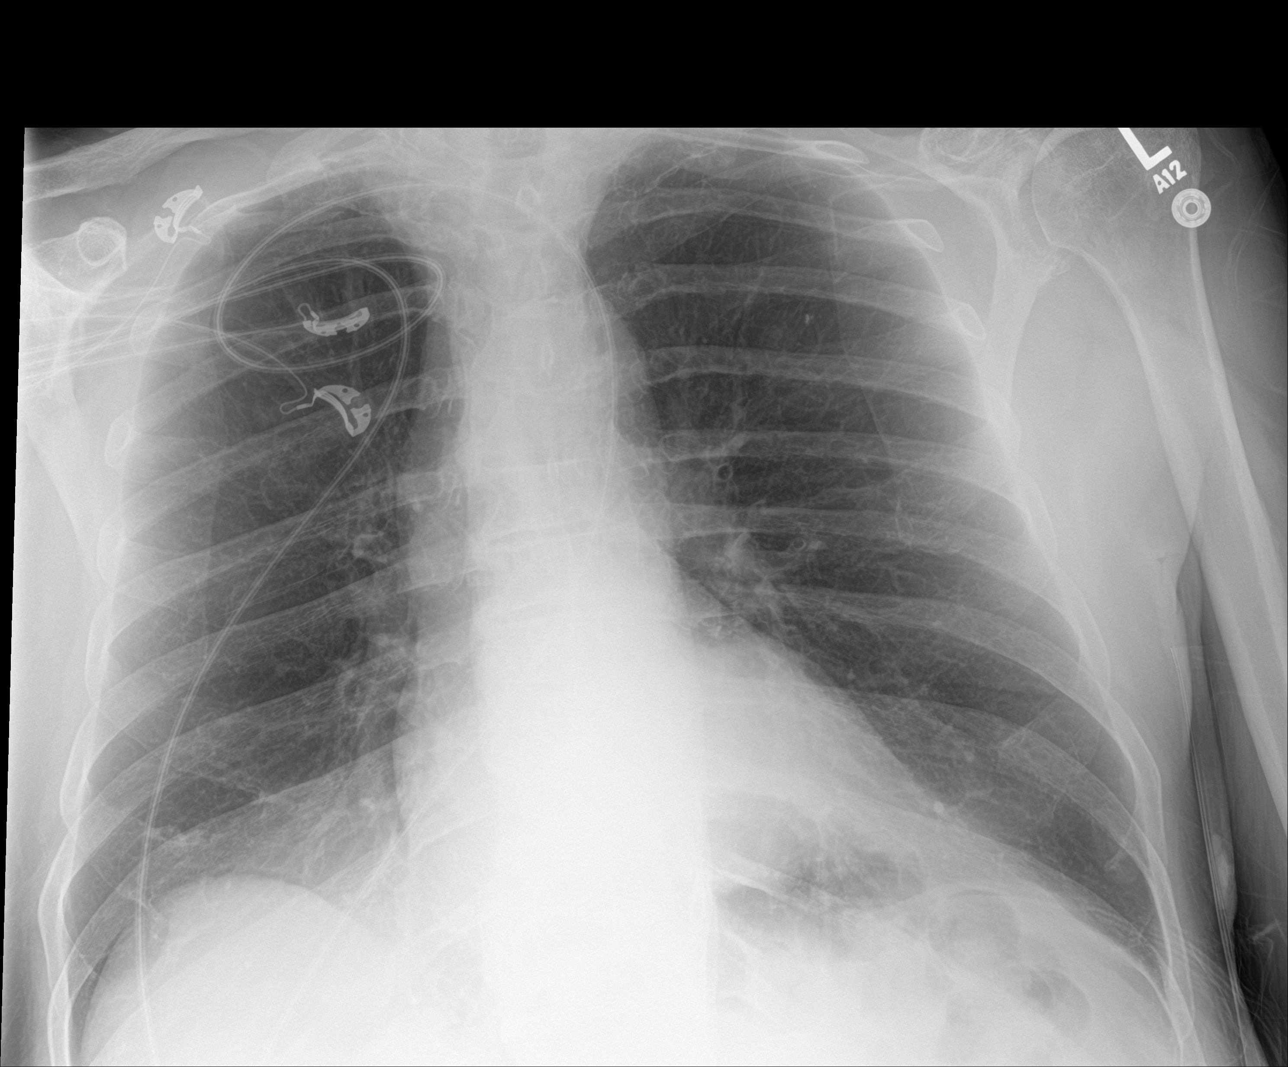

[chest lat (2 of 2)]
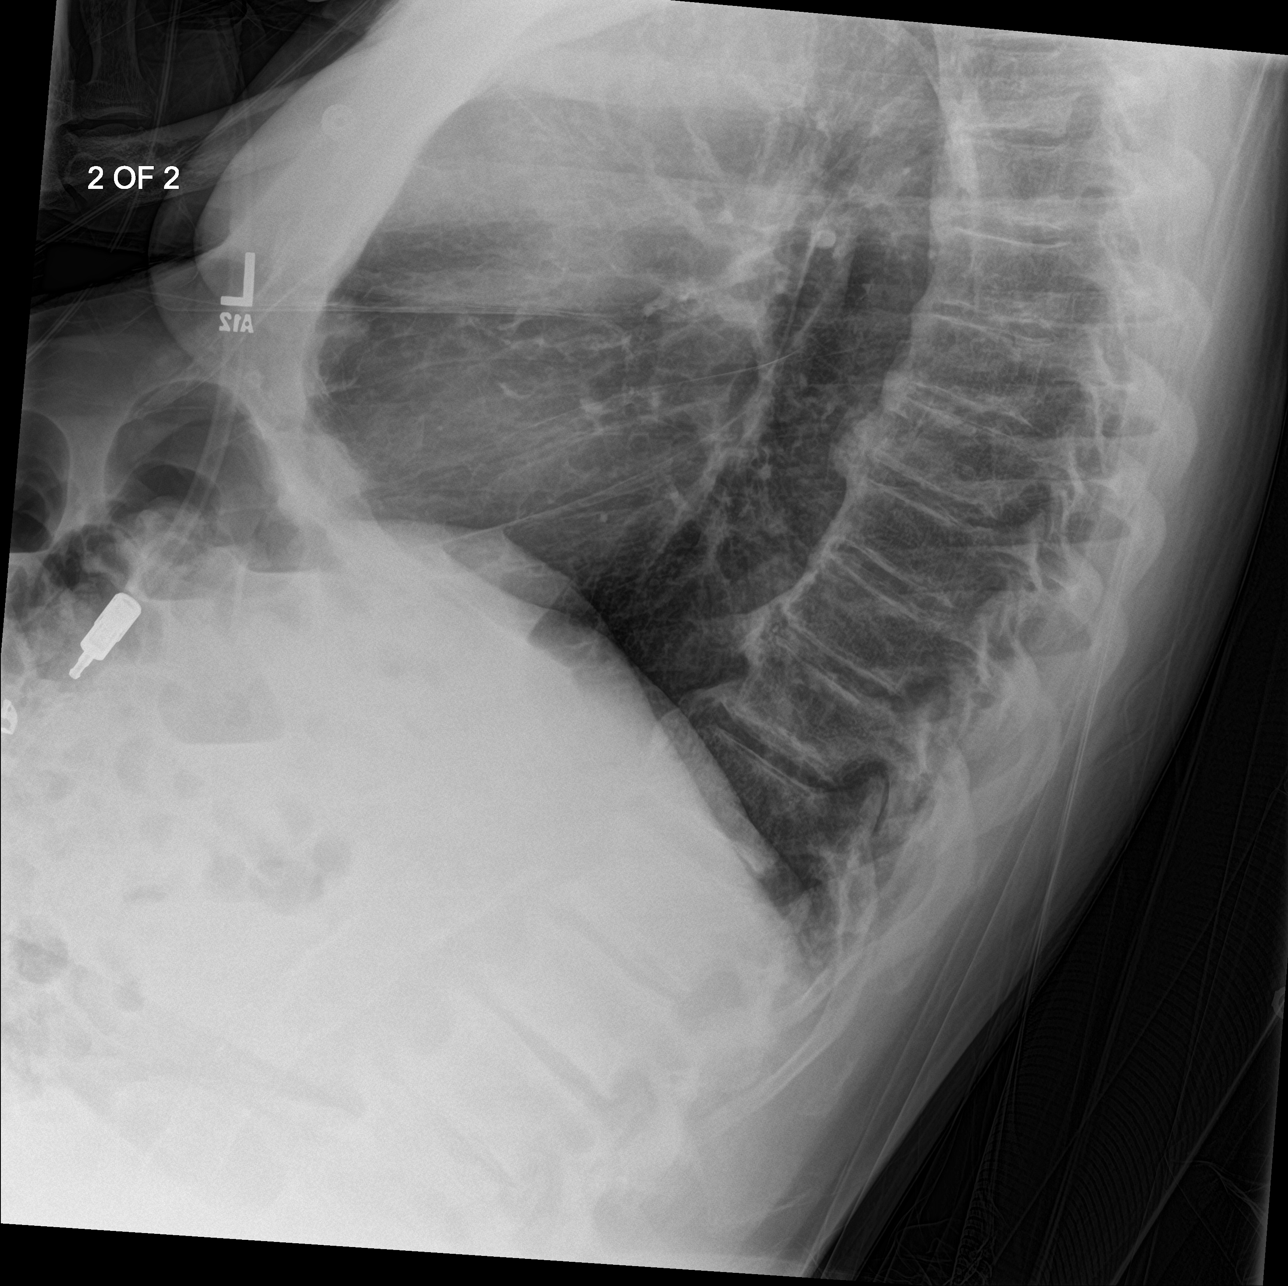

[3 of 3 positions shown; findings below may reference images not displayed]

FINDINGS: The heart size and mediastinal contours are within normal limits.
Both lungs are clear. No pneumothorax or pleural effusion is noted.
The visualized skeletal structures are unremarkable.
IMPRESSION: No active cardiopulmonary disease.

## 2018-07-19 ENCOUNTER — Other Ambulatory Visit: Payer: Self-pay | Admitting: Internal Medicine

## 2018-07-19 DIAGNOSIS — I350 Nonrheumatic aortic (valve) stenosis: Secondary | ICD-10-CM

## 2018-12-29 ENCOUNTER — Emergency Department (HOSPITAL_BASED_OUTPATIENT_CLINIC_OR_DEPARTMENT_OTHER)
Admission: EM | Admit: 2018-12-29 | Discharge: 2018-12-29 | Disposition: A | Discharge status: Home / Self Care | Payer: Medicare Other | Attending: Emergency Medicine | Admitting: Emergency Medicine

## 2018-12-29 ENCOUNTER — Other Ambulatory Visit: Payer: Self-pay

## 2018-12-29 ENCOUNTER — Emergency Department (HOSPITAL_BASED_OUTPATIENT_CLINIC_OR_DEPARTMENT_OTHER): Payer: Medicare Other

## 2018-12-29 ENCOUNTER — Encounter (HOSPITAL_BASED_OUTPATIENT_CLINIC_OR_DEPARTMENT_OTHER): Payer: Self-pay

## 2018-12-29 DIAGNOSIS — Z8673 Personal history of transient ischemic attack (TIA), and cerebral infarction without residual deficits: Secondary | ICD-10-CM | POA: Diagnosis not present

## 2018-12-29 DIAGNOSIS — Z96659 Presence of unspecified artificial knee joint: Secondary | ICD-10-CM | POA: Insufficient documentation

## 2018-12-29 DIAGNOSIS — Y9301 Activity, walking, marching and hiking: Secondary | ICD-10-CM | POA: Diagnosis not present

## 2018-12-29 DIAGNOSIS — Z955 Presence of coronary angioplasty implant and graft: Secondary | ICD-10-CM | POA: Diagnosis not present

## 2018-12-29 DIAGNOSIS — Y92009 Unspecified place in unspecified non-institutional (private) residence as the place of occurrence of the external cause: Secondary | ICD-10-CM | POA: Insufficient documentation

## 2018-12-29 DIAGNOSIS — S59902A Unspecified injury of left elbow, initial encounter: Secondary | ICD-10-CM | POA: Diagnosis present

## 2018-12-29 DIAGNOSIS — Z7901 Long term (current) use of anticoagulants: Secondary | ICD-10-CM | POA: Diagnosis not present

## 2018-12-29 DIAGNOSIS — W01198A Fall on same level from slipping, tripping and stumbling with subsequent striking against other object, initial encounter: Secondary | ICD-10-CM | POA: Insufficient documentation

## 2018-12-29 DIAGNOSIS — F0390 Unspecified dementia without behavioral disturbance: Secondary | ICD-10-CM | POA: Diagnosis not present

## 2018-12-29 DIAGNOSIS — I252 Old myocardial infarction: Secondary | ICD-10-CM | POA: Insufficient documentation

## 2018-12-29 DIAGNOSIS — I10 Essential (primary) hypertension: Secondary | ICD-10-CM | POA: Diagnosis not present

## 2018-12-29 DIAGNOSIS — S51012A Laceration without foreign body of left elbow, initial encounter: Secondary | ICD-10-CM | POA: Diagnosis not present

## 2018-12-29 DIAGNOSIS — Z79899 Other long term (current) drug therapy: Secondary | ICD-10-CM | POA: Insufficient documentation

## 2018-12-29 DIAGNOSIS — Z87891 Personal history of nicotine dependence: Secondary | ICD-10-CM | POA: Diagnosis not present

## 2018-12-29 DIAGNOSIS — Y998 Other external cause status: Secondary | ICD-10-CM | POA: Diagnosis not present

## 2018-12-29 DIAGNOSIS — W19XXXA Unspecified fall, initial encounter: Secondary | ICD-10-CM

## 2018-12-29 NOTE — Discharge Instructions (Signed)
The steristrips will just fall off the elbow when ready.  Keep dry.  Tylenol as needed for pain.

## 2018-12-29 NOTE — ED Triage Notes (Addendum)
Per pt's daughter pt fell on concrete ~5pm at home-pt with hx of dementia-skin tears to left UE-dsg in triage-states he hit head-no break in skin noted-no known LOC-pt NAD-to triage in w/c

## 2018-12-29 NOTE — ED Provider Notes (Signed)
[facility] EMERGENCY DEPARTMENT Provider Note   CSN: [id number] Arrival date & time: 12/29/18  1724     History   Chief Complaint Chief Complaint  Patient presents with  . Fall    HPI Wyatt [name] is a 85 y.o. male.     Patient is an 85 year old male with a history of hypertension, stroke, atrial fibrillation on Eliquis, coronary artery disease and dementia who presents today after an unwitnessed fall.  Patient lives at home with his wife and it was reported that he was outside and missed stepped most likely falling and hitting his left arm on the concrete.  Patient does not recall hitting his head and denies any headache but again this was not witnessed.  He denies any pain in his hips, back, stomach or chest.  He did get a cut on his left arm that bled initially quite a bit but has now stopped.  He states currently he has no pain at all.  The history is provided by the patient.  Fall This is a new problem. The current episode started 3 to 5 hours ago. The problem occurs constantly. The problem has not changed since onset.Pertinent negatives include no chest pain, no abdominal pain, no headaches and no shortness of breath.    Past Medical History:  Diagnosis Date  . Anxiety   . Atrial fibrillation ([place])   . CAD (coronary artery disease)   . CVA (cerebral infarction)    while in [place]  . Diverticulosis   . Hyperlipemia   . Hypertension   . Inferior MI ([place]) 2001   stent to RCA-Dr. Olevia Perches  . Lymphocytic colitis   . Personal history of colonic polyps 04/2009   SERRATED ADENOMA  . Stroke ([place])   . Syncope and collapse 2014   in [place] prior to the stroke by 24 hours.    Patient Active Problem List   Diagnosis Date Noted  . On anticoagulant therapy 06/10/2017  . Aortic valve stenosis 05/08/2016  . MCI (mild cognitive impairment) 07/26/2015  . Dysphasia 10/07/2014  . Hyponatremia 10/07/2014  . TIA (transient ischemic attack)   . Mild cognitive impairment  with memory loss 09/22/2014  . History of stroke 04/11/2014  . Mild cognitive impairment 08/11/2013  . Long term current use of anticoagulant therapy 04/11/2013  . PAF (paroxysmal atrial fibrillation) ([place]) 03/28/2013  . Atrial flutter ([place]) 03/28/2013  . Syncope 03/28/2013  . Hyposmolality and/or hyponatremia 03/28/2013  . Palpitations 03/15/2013  . Frequent PVCs 03/15/2013  . CAD in native artery 03/15/2013  . Abnormality of gait 03/01/2013  . Essential hypertension 03/01/2013  . Essential familial hyperlipidemia 03/01/2013  . Vitamin B12 deficiency 09/12/2010  . Diarrhea 08/26/2010    Past Surgical History:  Procedure Laterality Date  . CORONARY ANGIOPLASTY WITH STENT PLACEMENT  2001   Dr. Johnsie Cancel cathed, Dr Olevia Perches stented  . TOTAL KNEE ARTHROPLASTY          Home Medications    Prior to Admission medications   Medication Sig Start Date End Date Taking? Authorizing Provider  ELIQUIS 5 MG TABS tablet TAKE 1 TABLET BY MOUTH TWICE DAILY 03/02/17   Hilty, Nadean Corwin, MD  L-Methylfolate-B12-B6-B2 (CEREFOLIN) 10-24-48-5 MG TABS Take 1 tablet by mouth daily. Patient taking differently: Take 1 tablet by mouth every evening.  09/22/16   Dennie Bible, NP  LORazepam (ATIVAN) 0.5 MG tablet Take 0.5 mg by mouth at bedtime.     [provider]  metoprolol tartrate (LOPRESSOR) 25  MG tablet Take 12.5 mg by mouth 2 (two) times daily.    [provider]  tamsulosin (FLOMAX) 0.4 MG CAPS capsule Take 0.4 mg by mouth daily after breakfast.     [provider]    Family History Family History  Problem Relation Age of Onset  . Liver cancer Father   . Cancer Sister   . Cancer Sister   . Colon cancer Neg Hx     Social History Social History   Tobacco Use  . Smoking status: Former Games developermoker  . Smokeless tobacco: Never Used  Substance Use Topics  . Alcohol use: No    Frequency: Never  . Drug use: No     Allergies   Ampicillin and Metronidazole    Review of Systems Review of Systems  Respiratory: Negative for shortness of breath.   Cardiovascular: Negative for chest pain.  Gastrointestinal: Negative for abdominal pain.  Neurological: Negative for headaches.  All other systems reviewed and are negative.    Physical Exam Updated Vital Signs BP (!) 148/78 (BP Location: Right Arm)   Pulse 87   Temp 98.2 F (36.8 C) (Oral)   Resp 14   Ht 5\' 8"  (1.727 m)   Wt 77.1 kg   SpO2 97%   BMI 25.85 kg/m   Physical Exam Vitals signs and nursing note reviewed.  Constitutional:      General: He is not in acute distress.    Appearance: He is well-developed and normal weight.  HENT:     Head: Normocephalic and atraumatic.  Eyes:     Conjunctiva/sclera: Conjunctivae normal.     Pupils: Pupils are equal, round, and reactive to light.  Neck:     Musculoskeletal: Normal range of motion and neck supple.  Cardiovascular:     Rate and Rhythm: Normal rate. Rhythm irregularly irregular.     Pulses: Normal pulses.     Heart sounds: No murmur.  Pulmonary:     Effort: Pulmonary effort is normal. No respiratory distress.     Breath sounds: Normal breath sounds. No wheezing or rales.  Abdominal:     General: There is no distension.     Palpations: Abdomen is soft.     Tenderness: There is no abdominal tenderness. There is no guarding or rebound.  Musculoskeletal: Normal range of motion.        General: No tenderness.     Left elbow: He exhibits laceration.     Right hip: Normal.     Left hip: Normal.     Cervical back: Normal.     Thoracic back: Normal.       Arms:  Skin:    General: Skin is warm and dry.     Capillary Refill: Capillary refill takes less than 2 seconds.     Findings: No erythema or rash.  Neurological:     Mental Status: He is alert. Mental status is at baseline.     Comments: Oriented to person and place  Psychiatric:        Mood and Affect: Mood normal.        Behavior: Behavior normal.        Thought  Content: Thought content normal.      ED Treatments / Results  Labs (all labs ordered are listed, but only abnormal results are displayed) Labs Reviewed - No data to display  EKG None  Radiology No results found.  Procedures Procedures (including critical care time)  Medications Ordered in ED Medications - No  data to display   Initial Impression / Assessment and Plan / ED Course  I have reviewed the triage vital signs and the nursing notes.  Pertinent labs & imaging results that were available during my care of the patient were reviewed by me and considered in my medical decision making (see chart for details).        Elderly male presenting today after a fall at home when he was walking on uneven concrete.  However this fall was not witnessed and because he takes Eliquis and has dementia we will do a CT of the brain to ensure no acute bleeding.  Patient's son is present with him and states he is at his baseline mental status.  He does have a skin tear on the left arm which will be repaired with Steri-Strips.  Tetanus shot is up-to-date.  7:57 PM Head CT neg. nursing placed Steri-Strips and speaking with son he states his father is having worsening dementia and more trouble at home and they requested case management.  A consult was placed and they will also speak with her PCP about possible palliative care involvement.  Final Clinical Impressions(s) / ED Diagnoses   Final diagnoses:  Fall in home, initial encounter  Skin tear of left elbow without complication, initial encounter    ED Discharge Orders    None       Gwyneth SproutPlunkett, Gurpreet Mariani, MD 12/29/18 [id number]

## 2019-01-13 ENCOUNTER — Telehealth: Payer: Self-pay | Admitting: Internal Medicine

## 2019-01-13 NOTE — Telephone Encounter (Signed)
Spoke with wife [name] regarding Palliative services and she was in agreement with this.  She wanted to call me back to schedule the Consult because she wanted her son to be there as well.  She took my name and number and will call me back after she speaks with her son.

## 2019-01-17 NOTE — Telephone Encounter (Signed)
Rec'd a call back from wife [name] and I have scheduled a Telephone Palliative Consult for 01/21/19 @ 1 PM

## 2019-01-18 ENCOUNTER — Telehealth: Payer: Self-pay

## 2019-01-18 NOTE — Telephone Encounter (Signed)
Received a call from patient's wife to confirm time of visit scheduled for Friday 01/21/2019. Confirmed with wife it is scheduled for 12 noon

## 2019-01-21 ENCOUNTER — Other Ambulatory Visit: Payer: Medicare Other | Admitting: Internal Medicine

## 2019-01-21 ENCOUNTER — Other Ambulatory Visit: Payer: Self-pay

## 2019-01-21 DIAGNOSIS — Z515 Encounter for palliative care: Secondary | ICD-10-CM

## 2019-01-21 NOTE — Progress Notes (Signed)
[facility] Consult Note Telephone: [phone]  Fax: [phone]  PATIENT NAME: Wyatt [name] DOB: 03/02/1929 MRN: [id number]  PRIMARY CARE PROVIDER:   [name], [name], MD  REFERRING PROVIDER:  [name], PA  RESPONSIBLE PARTY:   Wyatt [name](wife) [phone], Wyatt [name] Prim(son) [phone]    RECOMMENDATIONS and PLAN:  Palliative Care Encounter  Z51.5  1. Advance Care Planning:  Introduced palliative and hospice care services to wife and son, Wyatt [name].  Discussed goals of care which are for patient to remain at home and receive assistance from wife and children and hired caregivers 3 days/week. Attempt to decrease evening confusion.  Advanced directives reviewed and code status of DNAR has been previously selected.  MOST form reviewed and will be further discussed with family members after receipt of copy. Prelimanary thoughts are for no IV or tube feedings.  Palliaitve Care will f/u with pt and family in aprox 2 weeks.   2.  Memory Loss with behaviors: FAST 6d.   Respiradol 0.25mg  PO at 4pm-5pm daily to improve sundowning.  ID bracelet or necklace.  Home security as needed. Day/night reorientation.  Continue to monitor and promote activities with guidance.  3.  Unsteady gait:  Fall risk prevention reviewed.  Monitor closely. Rollator prn. Consider d/c of anticoagulant if pt continues to have significant falls.   Due to the COVID-19 crisis, this visit was performed telephonically from my office and was initiated and consented by the patient and or family.   I spent 60 minutes providing this consultation,  from 1300 to 1400. More than 50% of the time in this consultation was spent coordinating communication with patient, wife and son Wyatt [name]. Collaborated with Dr. Barbee ShropshireHertweck. Left message for PCP regarding recommendations.   HISTORY OF PRESENT ILLNESS:  Wyatt [name] is a 85 y.o. year old male with multiple medical problems including  vascular dementia with behaviors, previous CVAs on Eliquis, CAD/MI and anxiety. Wife reports that he is able to dress self, is continent and assists with some ADLs, and eats well. 5# wt loss over past 4 months.  He does have increased confusion and anxiety beginning in the late afternoon.  He sleeps 10-22 hours per day. Recent in home fall on 8/7 striking head.  No cerebral injury noted.   Palliative Care was asked to help address goals of care.   CODE STATUS: DNAR/DNI  Completed document provided to wife.  PPS: 50% HOSPICE ELIGIBILITY/DIAGNOSIS: TBD  PAST MEDICAL HISTORY:  Past Medical History:  Diagnosis Date   Anxiety    Atrial fibrillation ([facility])    CAD (coronary artery disease)    CVA (cerebral infarction)    while in [place]   Diverticulosis    Hyperlipemia    Hypertension    Inferior MI ([facility]) 2001   stent to RCA-Dr. Juanda ChanceBrodie   Lymphocytic colitis    Personal history of colonic polyps 04/2009   SERRATED ADENOMA   Stroke [facility])    Syncope and collapse 2014   in [place] prior to the stroke by 24 hours.     PERTINENT MEDICATIONS:  Outpatient Encounter Medications as of 01/21/2019  Medication Sig   ELIQUIS 5 MG TABS tablet TAKE 1 TABLET BY MOUTH TWICE DAILY   L-Methylfolate-B12-B6-B2 (CEREFOLIN) 10-24-48-5 MG TABS Take 1 tablet by mouth daily. (Patient taking differently: Take 1 tablet by mouth every evening. )   LORazepam (ATIVAN) 0.5 MG tablet Take 0.5 mg by mouth at bedtime.    metoprolol tartrate (LOPRESSOR) 25 MG  tablet Take 12.5 mg by mouth 2 (two) times daily.   tamsulosin (FLOMAX) 0.4 MG CAPS capsule Take 0.4 mg by mouth daily after breakfast.    No facility-administered encounter medications on file as of 01/21/2019.     PHYSICAL EXAM:   General: NAD Neurological: Alert with clear speech.  Few spoken words.  Unable to complete additional physical examination due to telephonic visit.   Gonzella Lex, NP-C

## 2019-02-07 ENCOUNTER — Telehealth: Payer: Self-pay | Admitting: Internal Medicine

## 2019-02-07 NOTE — Telephone Encounter (Signed)
Follow-up email message sent to wife and son.  '[email or web address]' [email or web address]    [email or web address]  I have already sent in a prescription to [facility] at [place]  for Risperidone 0.25mg .  Take 1 tablet by mouth every afternoon between 4 and 5PM to attempt to decrease some of his anxiety, agitation and sundowning behaviors.  It may make him drowsy so make sure that he is ready for his evening meal close to that time.   I sent a copy of recommendations to his primary care and let him know of the prescription.  Let me know if you have any questions about the effects otherwise and I will see you on Thursday 02/10/19 at [place], NP-C

## 2019-02-10 ENCOUNTER — Other Ambulatory Visit: Payer: Medicare Other | Admitting: Internal Medicine

## 2019-02-10 ENCOUNTER — Other Ambulatory Visit: Payer: Self-pay

## 2019-02-10 DIAGNOSIS — Z515 Encounter for palliative care: Secondary | ICD-10-CM

## 2019-02-10 NOTE — Progress Notes (Signed)
    [facility] Consult Note Telephone: [phone]  Fax: [phone]  PATIENT NAME: Wyatt [name] DOB: [birth year] MRN: [id number]  PRIMARY CARE PROVIDER:   Jani Gravel, MD  REFERRING PROVIDER:  Daisy Lazar, PA  RESPONSIBLE PARTY:   [name] Mckenney(wife) [phone], [name] [place](son) [phone]    RECOMMENDATIONS and PLAN:  Palliative Care Encounter  Z51.5  1. Advance Care Planning: Re-discuss advanced directives during next appointment. Code status remains as DNAR.  Today's in person appt with Palliative Care was rescheduled to one week from now   2.  Memory Loss with behaviors: FAST 6d.   Respiradol 0.25mg  PO recommended but on hold per request of son and daughter. Promote calm and safe environment   Continue to monitor and promote activities with guidance.  Due to the COVID-19 crisis, this visit was performed telephonically from my office and was initiated and consented by the patient and or family.   I spent 30 minutes providing this consultation,  from 0930 to 1000. More than 50% of the time in this consultation was spent coordinating communication. Marland Kitchen   HISTORY OF PRESENT ILLNESS:  Follow-up with [name].  Son reports that pt had a restless night with getting OOB on several occasions and did not rest until 5 am.  He has received a single dose of Risperdal since it's prescription and rested well.  Son and daughter desire to hold use of same med for now until further discussion and would rather use non pharmacological methods to achieve relaxation.  No reports of falls or acute illnesses. Palliative Care was asked to help address goals of care.   CODE STATUS: DNAR/DNI    PPS: 50% HOSPICE ELIGIBILITY/DIAGNOSIS: TBD   Gonzella Lex, NP-C

## 2019-02-15 ENCOUNTER — Other Ambulatory Visit: Payer: Medicare Other | Admitting: Internal Medicine

## 2019-02-15 ENCOUNTER — Other Ambulatory Visit: Payer: Self-pay

## 2019-02-15 DIAGNOSIS — Z515 Encounter for palliative care: Secondary | ICD-10-CM

## 2019-02-21 NOTE — Progress Notes (Signed)
     [facility] Consult Note Telephone: [phone]  Fax: [phone]  PATIENT NAME: Wyatt [name] DOB: 1928/07/09 MRN: [id number]  PRIMARY CARE PROVIDER:  Daisy Lazar, PA  REFERRING PROVIDER:  Daisy Lazar, PA  RESPONSIBLE PARTY:   [name] Bova(wife) [phone], [name] [place](son) [phone]    RECOMMENDATIONS and PLAN:  Palliative Care Encounter  Z51.5  1. Advance Care Planning: Re-addressed goals and MOST form with wife and son [name]. Goals are to maintain residence, receive assistance from wife, adult children and hired caregivers(3x/week).  Home caregiver schedule can be increased in the future prn.  MOST form selections are for DNAR/DNI, comfort measures, determine ues of antibiotics, trial IV fluids and no tube feedings.  Document completed and left in the home with wife.  Palliative care will f/u with patient in aprox 1 month.   2.  Memory Loss with behaviors: FAST 6d.  At baseline. Family chose to hold Respiradol long-term.  Continue to monitor behaviors. Incorporate brain stimulating activities( assist with minor home activities, putting, etc.) with supervision.  Consider alternate medication use in the future if needed for control of behaviors.   I spent 30 minutes providing this consultation,  from 1000 to 1100. More than 50% of the time in this consultation was spent coordinating communication with patient, wife and son.  HISTORY OF PRESENT ILLNESS:  Follow-up with Wyatt [name].  Son reports that pt had a restless night with getting OOB on several occasions and did not rest until 5 am.  He has received a single dose of Risperdal since it's prescription and rested well.  Son and daughter desire to hold use of same med for now until further discussion and would rather use non pharmacological methods to achieve relaxation.  No reports of falls or acute illnesses. Palliative Care was asked to help address goals of care.    CODE STATUS: DNAR/DNI    PPS: 50% HOSPICE ELIGIBILITY/DIAGNOSIS: TBD  PHYSICAL EXAM:    General:  Well nourished elderly male in NAD Pulm:  Clear throughout CV:  RRR Abd:  Soft and non-tender.  Active BS Ext:  No edema.  Wabbled gait Neuro:  A&O to person.  Clear but confused speech.  Forgetful Psych:  Calm and cooperative in actions.  Evasive in answering some questions   Gonzella Lex, NP-C

## 2019-03-15 ENCOUNTER — Other Ambulatory Visit: Payer: Medicare Other | Admitting: Internal Medicine

## 2019-03-16 NOTE — Progress Notes (Signed)
    [facility] Consult Note Telephone: [phone]  Fax: [phone]  PATIENT NAME: Wyatt [name] DOB: December 16, 1928 MRN: [id number]  PRIMARY CARE PROVIDER:   Jani Gravel, MD  REFERRING PROVIDER:  Jani Gravel, MD [address] [place],  [place] [address]  RESPONSIBLE PARTY:   Wife Olive Motyka  RECOMMENDATIONS and PLAN:  Palliative Care Encounter Z51.5  1. Advance Care Planning:  Wife continues to desire for pt to reside at home and have treatment for increased anxiety and confusion.  She will discuss with PCP on 10/21 as recommended during influenza vaccine appt.  Advanced directives previously completed.  Palliative care will continue to follow.   2. Memory loss with behaviors:  FAST 7.  Consider initiation of Seroquel or Haldol during evening hours to attempt improvement of sun downing.  Increased confusion with previous Ativan use.  Increase care giver frequency to 5 days/week. Pending f/u with pt on 11/10  Due to the COVID-19 crisis, this visit occurred telephonically and was initiated by the patient and or family   I spent 25 minutes providing this consultation,  from 1730 to 1755. More than 50% of the time in this consultation was spent coordinating communication.  Discussed pt and wife status with Daisy Lazar, PA  HISTORY OF PRESENT ILLNESS: Follow-up with [name] .  Wi and son report that patient has increased anxiety/confusion beginning late afternoon to evening hours .  He repeatedly asks to "go home" and is unaware of surroundings and family members.  Wife reports increased sleeping during day and night.  He remains ambulatory, feeds self and can assist with his own ADLs. Wife has pending surgery and oncology appointments.  Palliative Care was asked to help address goals of care.   CODE STATUS: DNAR/DNI  PPS: 50% HOSPICE ELIGIBILITY/DIAGNOSIS: TBD  PAST MEDICAL HISTORY:  Past Medical History:  Diagnosis  Date  . Anxiety   . Atrial fibrillation ([place])   . CAD (coronary artery disease)   . CVA (cerebral infarction)    while in [place]  . Diverticulosis   . Hyperlipemia   . Hypertension   . Inferior MI ([place]) 2001   stent to RCA-Dr. Olevia Perches  . Lymphocytic colitis   . Personal history of colonic polyps 04/2009   SERRATED ADENOMA  . Stroke ([place])   . Syncope and collapse 2014   in [place] prior to the stroke by 24 hours.    PERTINENT MEDICATIONS:  Outpatient Encounter Medications as of 03/15/2019  Medication Sig  . ELIQUIS 5 MG TABS tablet TAKE 1 TABLET BY MOUTH TWICE DAILY  . L-Methylfolate-B12-B6-B2 (CEREFOLIN) 10-24-48-5 MG TABS Take 1 tablet by mouth daily. (Patient taking differently: Take 1 tablet by mouth every evening. )  . LORazepam (ATIVAN) 0.5 MG tablet Take 0.5 mg by mouth at bedtime.   . metoprolol tartrate (LOPRESSOR) 25 MG tablet Take 12.5 mg by mouth 2 (two) times daily.  . tamsulosin (FLOMAX) 0.4 MG CAPS capsule Take 0.4 mg by mouth daily after breakfast.    No facility-administered encounter medications on file as of 03/15/2019.       Gonzella Lex, NP-C

## 2019-03-25 ENCOUNTER — Other Ambulatory Visit: Payer: Self-pay

## 2019-03-25 DIAGNOSIS — Z20822 Contact with and (suspected) exposure to covid-19: Secondary | ICD-10-CM

## 2019-03-26 LAB — NOVEL CORONAVIRUS, NAA: SARS-CoV-2, NAA: NOT DETECTED

## 2019-04-05 ENCOUNTER — Other Ambulatory Visit: Payer: Medicare Other | Admitting: Internal Medicine

## 2019-04-05 ENCOUNTER — Other Ambulatory Visit: Payer: Self-pay

## 2019-04-05 DIAGNOSIS — Z515 Encounter for palliative care: Secondary | ICD-10-CM

## 2019-04-05 DIAGNOSIS — F0151 Vascular dementia with behavioral disturbance: Secondary | ICD-10-CM

## 2019-04-05 DIAGNOSIS — F01518 Vascular dementia, unspecified severity, with other behavioral disturbance: Secondary | ICD-10-CM

## 2019-04-05 NOTE — Progress Notes (Signed)
    [facility] Consult Note Telephone: [phone]  Fax: [phone]  PATIENT NAME: Wyatt [name] DOB: 13-Mar-1929 MRN: [id number]  PRIMARY CARE PROVIDER:   Daisy Lazar, PA  REFERRING PROVIDER:  Daisy Lazar, PA  RESPONSIBLE PARTY:   Wife Tomislav Micale [phone]  RECOMMENDATIONS and PLAN:  Palliative Care Encounter Z51.5 Vascular dementia with behavior disturbance F01.51   1. Advance Care Planning:  Wife continues to desire for pt to reside at home and have increased assistance by family and hired caregivers. MOST and DNR form previously completed with desire for comfort care.  Palliative care will continue to follow on a prn basis as per request of wife.   2. Vascular dementia with behaviors:  FAST 7a.  Less agitation currently without use of prn medications.  Monitor for recurrent onset of agitation with sundowning and consider initiation of Seroquel or Haldol during evening hours at that time.  Aromatherapy as desired. Incorporate "busy" activities. Continue to provide safe environment with monitored activities.  Due to the COVID-19 crisis, this visit occurred telephonically and was initiated by the patient and or family   I spent 30 minutes providing this consultation,  from 1000 to 1030. More than 50% of the time in this consultation was spent coordinating communication.    HISTORY OF PRESENT ILLNESS: Follow-up with [name].    Wife reports increased sleeping during day and night and he is still able to feed and dress self.  He follows his wife around in the home and appears to have increased confusion and forgetfulness per wife. He has a good appetite and weight remains stable.  Pt was exposed to [place] within his home but had a negative test.  Wife has pending surgery and oncology appointments.  Palliative Care was asked to help address goals of care.   CODE STATUS: DNAR/DNI  PPS: 50% HOSPICE ELIGIBILITY/DIAGNOSIS:  TBD  PAST MEDICAL HISTORY:  Past Medical History:  Diagnosis Date  . Anxiety   . Atrial fibrillation ([place])   . CAD (coronary artery disease)   . CVA (cerebral infarction)    while in [place]  . Diverticulosis   . Hyperlipemia   . Hypertension   . Inferior MI ([facility]) 2001   stent to RCA-Dr. Olevia Perches  . Lymphocytic colitis   . Personal history of colonic polyps 04/2009   SERRATED ADENOMA  . Stroke ([place])   . Syncope and collapse 2014   in [place] prior to the stroke by 24 hours.  -Vascular dementia with behavior disturbance   Gonzella Lex, NP-C

## 2019-08-02 ENCOUNTER — Ambulatory Visit: Payer: Medicare Other | Admitting: Internal Medicine

## 2019-08-16 ENCOUNTER — Encounter: Payer: Self-pay | Admitting: Internal Medicine

## 2019-09-01 ENCOUNTER — Encounter: Payer: Self-pay | Admitting: Internal Medicine

## 2019-09-01 ENCOUNTER — Ambulatory Visit (INDEPENDENT_AMBULATORY_CARE_PROVIDER_SITE_OTHER): Payer: Medicare Other | Admitting: Internal Medicine

## 2019-09-01 ENCOUNTER — Other Ambulatory Visit: Payer: Self-pay

## 2019-09-01 VITALS — BP 130/62 | HR 55 | Ht 68.0 in | Wt 162.6 lb

## 2019-09-01 DIAGNOSIS — I10 Essential (primary) hypertension: Secondary | ICD-10-CM

## 2019-09-01 DIAGNOSIS — I350 Nonrheumatic aortic (valve) stenosis: Secondary | ICD-10-CM | POA: Diagnosis not present

## 2019-09-01 DIAGNOSIS — I480 Paroxysmal atrial fibrillation: Secondary | ICD-10-CM | POA: Diagnosis not present

## 2019-09-01 DIAGNOSIS — E782 Mixed hyperlipidemia: Secondary | ICD-10-CM

## 2019-09-01 NOTE — Progress Notes (Signed)
OFFICE NOTE  Chief Complaint:  Routine follow-up  Primary Care Physician: Pearson Grippe, MD  HPI:  Wyatt [name] is an 86 year old gentleman from [place] originally with history of hypertension, dyslipidemia, paroxysmal A-fib and an MI with a stent to the right coronary in 2001. Recently had a TIA with some vision loss in the right eye. He has some mild aortic stenosis with peak and mean gradients of 22 and 10 mmHg. His EF has been reserved. He was previously on Pradaxa but refused to take that or warfarin and has been on Plavix and aspirin which he seems to be able to tolerate. However, recently he has discontinued the Plavix and is only taking aspirin once or twice per week. In fact, he is not taking many medications including Lipitor which he stopped which he said was worsening his memory loss. He was also started on Namenda, however, stopped taking that as well. At this point, he seems to be content not taking medications although he is at risk for future strokes and/or worsening heart failure. I counseled him extensively about his risk for stroke and my concern for possible atrial fibrillation. He refused to wear a monitor in the past. Unfortunately, he was recently hospitalized in [place] with a presumptive diagnosis of stroke. He did have a syncopal episode, and imaging studies demonstrated bilateral cerebral small vessel disease. He was noted to be hyponatremic.  He had no cardiac monitoring at that time. He is also felt to have acute gout and was placed on steroids which improved his symptoms. Apparently he was unresponsive at home and had collapsed. There is no evidence for cardiac dysrhythmia noted during the hospital stay however he was hyponatremic with a sodium of 125. He refused Holter monitoring at discharge. Ultimately he was sent to rehabilitation in [place] which he completed in 3 weeks.  Since that time I spoken with his wife who noted that he was complaining more of  palpitations. Ultimately he was agreeable to wearing a Holter monitor. The monitor demonstrated new onset atrial flutter at a rate of 162. There were also episodes of atrial fibrillation, PVCs and PSVT which was 10 beats on 03/18/2013. He notes that he does continue to have these episodes.  He was previously on a beta blocker, however this was discontinued by him along with other medications. He does appear to be more depressed today he reports that he actually feels less interested in doing the things he typically would do. He used to play golf frequently and notes that now he has no interest and it anymore and feels that his body is "giving up on him".  Wyatt [name] returns today and as actually feeling quite well. He seems to be tolerating Eliquis without any bleeding problems. He feels that his deficits are almost completely resolved. His wife and he just got back from several months up in [place]. He reports that he is now starting to hit golf balls again. He continues to have problems with short-term memory loss but is not had recurrent atrial fibrillation, chest pain or worsening shortness of breath. He is now on salt tablets for presumed orthostatic symptoms in addition to his blood pressure medications.  I saw Wyatt [name] back in the office today. Overall he is doing well denies any chest pain or worsening shortness of breath. He's had no bleeding problems on Eliquis. He's had no further stroke or TIA that were aware of. Heart rate and blood pressure at goal.  05/08/2016  Wyatt [name] returns today for follow-up. He recently saw Wyatt Demark, PA-C in the office who evaluated him for dizziness. She ordered an echocardiogram due to a systolic murmur. He has known aortic stenosis. This indicated normal LV function however his aortic stenosis is unchanged and mild. I don't think this is playing a role in his dizziness. His wife tells me today that yesterday he had what they thought was a TIA event. She  said that for about 40 minutes he had difficulty with word finding and eventually that resolved. He did not present to the emergency department. He apparently recently saw his neurologist.  06/10/2017  Wyatt [name] was seen today in follow-up.  He denies any chest pain or worsening shortness of breath.  His wife says that his memory has been worsening somewhat.  They are back from [place] for several months.  Blood pressure today was reasonably well controlled.  He does have an aortic murmur which demonstrated mild aortic stenosis on his last echo.  He will be due for another echo next year.  His wife said he has had a couple episodes with speech difficulty which she felt may be TIA events, but no other significant strokes.  He is done well on Eliquis without any bleeding problems.  Recent labs were brought from [place] and indicating a normal metabolic profile, creatinine of 1.02 and a lipid profile with total cholesterol 143, triglycerides 101, HDL 57 and LDL 66 indicating he is at goal.  09/01/2019  Wyatt [name] returns today for follow-up with his wife.  He continues to have issues with some cognitive decline and TIA.  He recently had a more significant TIA.  He remains on Eliquis.  He has not been on any other antiplatelets.  He is on low-dose metoprolol.  Blood pressure is well controlled.  EKG shows a sinus bradycardia with right bundle branch block.  He previously was noted to have some mild aortic stenosis.  I feel like he is due again for repeat echo.  PMHx:  Past Medical History:  Diagnosis Date   Anxiety    Atrial fibrillation ([facility])    CAD (coronary artery disease)    CVA (cerebral infarction)    while in [place]   Diverticulosis    Hyperlipemia    Hypertension    Inferior MI ([facility]) 2001   stent to RCA-Dr. Juanda Chance   Lymphocytic colitis    Personal history of colonic polyps 04/2009   SERRATED ADENOMA   Stroke [facility])    Syncope and collapse 2014   in [place] prior to the stroke by  24 hours.    Past Surgical History:  Procedure Laterality Date   CORONARY ANGIOPLASTY WITH STENT PLACEMENT  2001   Dr. Eden Emms cathed, Dr Juanda Chance stented   TOTAL KNEE ARTHROPLASTY      FAMHx:  Family History  Problem Relation Age of Onset   Liver cancer Father    Cancer Sister    Cancer Sister    Colon cancer Neg Hx     SOCHx:   reports that he has quit smoking. He has never used smokeless tobacco. He reports that he does not drink alcohol or use drugs.  ALLERGIES:  Allergies  Allergen Reactions   Ampicillin Other (See Comments)    REACTION: unspecified   Metronidazole Other (See Comments)    Burning skin and hands    ROS: Pertinent items noted in HPI and remainder of comprehensive ROS otherwise negative.  HOME MEDS: Current Outpatient Medications  Medication Sig  Dispense Refill   ELIQUIS 5 MG TABS tablet TAKE 1 TABLET BY MOUTH TWICE DAILY 180 tablet 1   L-Methylfolate-B12-B6-B2 (CEREFOLIN) 10-24-48-5 MG TABS Take 1 tablet by mouth daily. (Patient taking differently: Take 1 tablet by mouth every evening. ) 90 each 3   LORazepam (ATIVAN) 0.5 MG tablet Take 0.5 mg by mouth at bedtime.      metoprolol tartrate (LOPRESSOR) 25 MG tablet Take 12.5 mg by mouth 2 (two) times daily.     tamsulosin (FLOMAX) 0.4 MG CAPS capsule Take 0.4 mg by mouth daily after breakfast.      No current facility-administered medications for this visit.    LABS/IMAGING: No results found for this or any previous visit (from the past 48 hour(s)). No results found.  VITALS: BP 130/62    Pulse (!) 55    Ht 5\' 8"  (1.727 m)    Wt 162 lb 9.6 oz (73.8 kg)    BMI 24.72 kg/m   EXAM: General appearance: alert and no distress Neck: no carotid bruit and no JVD Lungs: clear to auscultation bilaterally Heart: regular rate and rhythm, S1, S2 normal and systolic murmur: midsystolic 3/6, crescendo at 2nd right intercostal space Abdomen: soft, non-tender; bowel sounds normal; no masses,  no  organomegaly Extremities: extremities normal, atraumatic, no cyanosis or edema Pulses: 2+ and symmetric Skin: Skin color, texture, turgor normal. No rashes or lesions Neurologic: Grossly normal Psych: Mood, affect normal  EKG: Sinus bradycardia at 55, RBBB-personally reviewed  ASSESSMENT: 1. History of TIA 2. Paroxysmal atrial fibrillation/flutter - now in sinus 3. Nonsustained VT and PVCs - resolved 4. Syncope/stroke - started on salt tablets per neurology 5. History of TIAs - on eliquis  6. Dyslipidemia-on statin  7. Aortic stenosis  PLAN: 1.   Wyatt [name] has had recurrent TIA.  He had mild aortic stenosis in 2019 however on exam today he sounds more moderate.  I would like to repeat the echo to see if his aortic stenosis has progressed.  He will remain on Eliquis.  Neurology has not recommended adding aspirin or another agent despite the fact that he has had some recurrent TIA.  No evidence of recurrent A. fib or flutter today on EKG.  Follow-up annually or sooner as necessary.  Pixie Casino, MD, [facility] [place] Director of the Advanced Lipid Disorders &  Cardiovascular Risk Reduction Clinic Diplomate of the American Board of Clinical Lipidology Attending Cardiologist  Direct Dial: [phone]   Fax: [phone]  Website:  [email or web address] 09/01/2019, 11:09 AM

## 2019-09-01 NOTE — Patient Instructions (Signed)
Medication Instructions:  Your physician recommends that you continue on your current medications as directed. Please refer to the Current Medication list given to you today.  *If you need a refill on your cardiac medications before your next appointment, please call your pharmacy*  Testing/Procedures: Your physician has requested that you have an echocardiogram. Echocardiography is a painless test that uses sound waves to create images of your heart. It provides your doctor with information about the size and shape of your heart and how well your heart's chambers and valves are working. This procedure takes approximately one hour. There are no restrictions for this procedure. -- [address]   Follow-Up: At [facility], you and your health needs are our priority.  As part of our continuing mission to provide you with exceptional heart care, we have created designated Provider Care Teams.  These Care Teams include your primary Cardiologist (physician) and Advanced Practice Providers (APPs -  Physician Assistants and Nurse Practitioners) who all work together to provide you with the care you need, when you need it.  We recommend signing up for the patient portal called "MyChart".  Sign up information is provided on this After Visit Summary.  MyChart is used to connect with patients for Virtual Visits (Telemedicine).  Patients are able to view lab/test results, encounter notes, upcoming appointments, etc.  Non-urgent messages can be sent to your provider as well.   To learn more about what you can do with MyChart, go to [email or web address].    Your next appointment:   1-2 month(s)  The format for your next appointment:   In Person  Provider:   You may see Dr. Kenneth Hilty or one of the following Advanced Practice Providers on your designated Care Team:    Hao Meng, PA-C  Angela Duke, PA-C or   Krista Kroeger, PA-C    Other Instructions   

## 2019-09-09 ENCOUNTER — Telehealth (HOSPITAL_COMMUNITY): Payer: Self-pay | Admitting: Radiology

## 2019-09-09 ENCOUNTER — Encounter (HOSPITAL_COMMUNITY): Payer: Self-pay | Admitting: Radiology

## 2019-09-09 NOTE — Telephone Encounter (Signed)
Returned call from patient's wife. She is canceling the scheduled echocardiogram. In the setting of her husband's dementia, if the echocardiogram showed any abnormality she would not put him through surgery. She would like to thank Dr. Rennis Golden for his care.

## 2019-09-09 NOTE — Telephone Encounter (Signed)
Will route to MD and nurse to make them aware.  Thank you!

## 2019-09-11 NOTE — Telephone Encounter (Signed)
Thanks

## 2019-09-14 ENCOUNTER — Telehealth (HOSPITAL_COMMUNITY): Payer: Self-pay | Admitting: Internal Medicine

## 2019-09-14 ENCOUNTER — Other Ambulatory Visit (HOSPITAL_COMMUNITY): Payer: Medicare Other

## 2019-09-14 NOTE — Telephone Encounter (Signed)
Patient was scheduled for echocardiogram for 09/14/19 and wife cancelled VIA automated system and called the office.  I asked to reschedule the appointment but she declined and states that patient has severe dementia and if he needed a procedure she would not do anyway due to severity of dsease.

## 2019-09-14 NOTE — Telephone Encounter (Signed)
Sent to MD as FYI

## 2019-12-05 ENCOUNTER — Telehealth: Payer: Self-pay

## 2019-12-05 NOTE — Telephone Encounter (Signed)
Palliative volunteer check in call, patient doing ok, call back in few weeks

## 2020-04-17 ENCOUNTER — Emergency Department (HOSPITAL_COMMUNITY): Payer: Medicare Other

## 2020-04-17 ENCOUNTER — Emergency Department (HOSPITAL_COMMUNITY)
Admission: EM | Admit: 2020-04-17 | Discharge: 2020-04-17 | Disposition: A | Discharge status: Home / Self Care | Payer: Medicare Other | Attending: Emergency Medicine | Admitting: Emergency Medicine

## 2020-04-17 ENCOUNTER — Encounter (HOSPITAL_COMMUNITY): Payer: Self-pay | Admitting: Emergency Medicine

## 2020-04-17 ENCOUNTER — Other Ambulatory Visit: Payer: Self-pay

## 2020-04-17 DIAGNOSIS — Z7901 Long term (current) use of anticoagulants: Secondary | ICD-10-CM | POA: Diagnosis not present

## 2020-04-17 DIAGNOSIS — R4182 Altered mental status, unspecified: Secondary | ICD-10-CM

## 2020-04-17 DIAGNOSIS — Z955 Presence of coronary angioplasty implant and graft: Secondary | ICD-10-CM | POA: Diagnosis not present

## 2020-04-17 DIAGNOSIS — E871 Hypo-osmolality and hyponatremia: Secondary | ICD-10-CM | POA: Diagnosis not present

## 2020-04-17 DIAGNOSIS — Z8673 Personal history of transient ischemic attack (TIA), and cerebral infarction without residual deficits: Secondary | ICD-10-CM | POA: Insufficient documentation

## 2020-04-17 DIAGNOSIS — Z86010 Personal history of colonic polyps: Secondary | ICD-10-CM | POA: Insufficient documentation

## 2020-04-17 DIAGNOSIS — Z96659 Presence of unspecified artificial knee joint: Secondary | ICD-10-CM | POA: Insufficient documentation

## 2020-04-17 DIAGNOSIS — I2510 Atherosclerotic heart disease of native coronary artery without angina pectoris: Secondary | ICD-10-CM | POA: Insufficient documentation

## 2020-04-17 DIAGNOSIS — I10 Essential (primary) hypertension: Secondary | ICD-10-CM | POA: Insufficient documentation

## 2020-04-17 DIAGNOSIS — Z87891 Personal history of nicotine dependence: Secondary | ICD-10-CM | POA: Diagnosis not present

## 2020-04-17 DIAGNOSIS — Z79899 Other long term (current) drug therapy: Secondary | ICD-10-CM | POA: Insufficient documentation

## 2020-04-17 LAB — CBC WITH DIFFERENTIAL/PLATELET
Abs Immature Granulocytes: 0.05 10*3/uL (ref 0.00–0.07)
Basophils Absolute: 0 10*3/uL (ref 0.0–0.1)
Basophils Relative: 1 %
Eosinophils Absolute: 0.2 10*3/uL (ref 0.0–0.5)
Eosinophils Relative: 2 %
HCT: 40 % (ref 39.0–52.0)
Hemoglobin: 13.8 g/dL (ref 13.0–17.0)
Immature Granulocytes: 1 %
Lymphocytes Relative: 22 %
Lymphs Abs: 1.7 10*3/uL (ref 0.7–4.0)
MCH: 33.7 pg (ref 26.0–34.0)
MCHC: 34.5 g/dL (ref 30.0–36.0)
MCV: 97.6 fL (ref 80.0–100.0)
Monocytes Absolute: 0.9 10*3/uL (ref 0.1–1.0)
Monocytes Relative: 11 %
Neutro Abs: 4.9 10*3/uL (ref 1.7–7.7)
Neutrophils Relative %: 63 %
Platelets: 188 10*3/uL (ref 150–400)
RBC: 4.1 MIL/uL — ABNORMAL LOW (ref 4.22–5.81)
RDW: 13.2 % (ref 11.5–15.5)
WBC: 7.7 10*3/uL (ref 4.0–10.5)
nRBC: 0 % (ref 0.0–0.2)

## 2020-04-17 LAB — PROTIME-INR
INR: 1.3 — ABNORMAL HIGH (ref 0.8–1.2)
Prothrombin Time: 15.3 seconds — ABNORMAL HIGH (ref 11.4–15.2)

## 2020-04-17 LAB — COMPREHENSIVE METABOLIC PANEL
ALT: 16 U/L (ref 0–44)
AST: 16 U/L (ref 15–41)
Albumin: 3.5 g/dL (ref 3.5–5.0)
Alkaline Phosphatase: 54 U/L (ref 38–126)
Anion gap: 10 (ref 5–15)
BUN: 13 mg/dL (ref 8–23)
CO2: 24 mmol/L (ref 22–32)
Calcium: 8.9 mg/dL (ref 8.9–10.3)
Chloride: 94 mmol/L — ABNORMAL LOW (ref 98–111)
Creatinine, Ser: 1.14 mg/dL (ref 0.61–1.24)
GFR, Estimated: >60 mL/min (ref >60)
Glucose, Bld: 70 mg/dL (ref 70–99)
Potassium: 4.2 mmol/L (ref 3.5–5.1)
Sodium: 128 mmol/L — ABNORMAL LOW (ref 135–145)
Total Bilirubin: 0.9 mg/dL (ref 0.3–1.2)
Total Protein: 5.7 g/dL — ABNORMAL LOW (ref 6.5–8.1)

## 2020-04-17 LAB — TROPONIN I (HIGH SENSITIVITY): Troponin I (High Sensitivity): 6 ng/L (ref <18)

## 2020-04-17 MED ORDER — SODIUM CHLORIDE 0.9 % IV BOLUS
1000.0000 mL | Freq: Once | INTRAVENOUS | Status: AC
  Administered 2020-04-17: 1000 mL via INTRAVENOUS

## 2020-04-17 MED ORDER — LORAZEPAM 2 MG/ML IJ SOLN
0.5000 mg | Freq: Once | INTRAMUSCULAR | Status: AC
  Administered 2020-04-17: 0.5 mg via INTRAVENOUS
  Filled 2020-04-17: qty 1

## 2020-04-17 NOTE — ED Triage Notes (Addendum)
Patient arrives to ED with chief complaint of altered mental status. Pt was found unresponsive by home health worker. Was seen at baseline around 10:45 this morning. Initial GCS was 3 by EMS. Pt initial O2 sats were low 80's and was put on 15L NRB due to DNR/DNI. Pt GCS 10. Family states pt was complaining of pain to left temple over the past week.

## 2020-04-17 NOTE — ED Provider Notes (Signed)
[facility] EMERGENCY DEPARTMENT Provider Note   CSN: [id number] Arrival date & time: 04/17/20  1245     History Chief Complaint  Patient presents with  . Unresponsive  . Altered Mental Status    Wyatt [name] is a 86 y.o. male.  HPI Level 5 caveat due to unresponsiveness. Patient brought in by EMS.  Reportedly last normal at 1045.  Then became unresponsive.  Nonverbal.  Will not follow commands.  Reportedly had hypoxic episode 2.  Sats down in the 80s.  Reported initial GCS of 3.  Patient is a DNR and would only want limited intervention per MOST form.  Reportedly walks and talks at baseline.  Reportedly alert and oriented at baseline per EMS.     Past Medical History:  Diagnosis Date  . Anxiety   . Atrial fibrillation (HCC)   . CAD (coronary artery disease)   . CVA (cerebral infarction)    while in [place]  . Diverticulosis   . Hyperlipemia   . Hypertension   . Inferior MI (HCC) 2001   stent to RCA-Dr. Juanda Chance  . Lymphocytic colitis   . Personal history of colonic polyps 04/2009   SERRATED ADENOMA  . Stroke (HCC)   . Syncope and collapse 2014   in [place] prior to the stroke by 24 hours.    Patient Active Problem List   Diagnosis Date Noted  . On anticoagulant therapy 06/10/2017  . Aortic valve stenosis 05/08/2016  . MCI (mild cognitive impairment) 07/26/2015  . Dysphasia 10/07/2014  . Hyponatremia 10/07/2014  . TIA (transient ischemic attack)   . Mild cognitive impairment with memory loss 09/22/2014  . History of stroke 04/11/2014  . Mild cognitive impairment 08/11/2013  . Long term current use of anticoagulant therapy 04/11/2013  . PAF (paroxysmal atrial fibrillation) (HCC) 03/28/2013  . Atrial flutter (HCC) 03/28/2013  . Syncope 03/28/2013  . Hyposmolality and/or hyponatremia 03/28/2013  . Palpitations 03/15/2013  . Frequent PVCs 03/15/2013  . CAD in native artery 03/15/2013  . Abnormality of gait 03/01/2013  . Essential hypertension  03/01/2013  . Essential familial hyperlipidemia 03/01/2013  . Vitamin B12 deficiency 09/12/2010  . Diarrhea 08/26/2010    Past Surgical History:  Procedure Laterality Date  . CORONARY ANGIOPLASTY WITH STENT PLACEMENT  2001   Dr. Eden Emms cathed, Dr Juanda Chance stented  . TOTAL KNEE ARTHROPLASTY         Family History  Problem Relation Age of Onset  . Liver cancer Father   . Cancer Sister   . Cancer Sister   . Colon cancer Neg Hx     Social History   Tobacco Use  . Smoking status: Former Games developer  . Smokeless tobacco: Never Used  Vaping Use  . Vaping Use: Never used  Substance Use Topics  . Alcohol use: No  . Drug use: No    Home Medications Prior to Admission medications   Medication Sig Start Date End Date Taking? Authorizing Provider  acetaminophen (TYLENOL) 325 MG tablet Take 325 mg by mouth every 6 (six) hours as needed for mild pain.    Yes [provider]  ELIQUIS 5 MG TABS tablet TAKE 1 TABLET BY MOUTH TWICE DAILY Patient taking differently: Take 5 mg by mouth 2 (two) times daily.  03/02/17  Yes Hilty, Lisette Abu, MD  LORazepam (ATIVAN) 0.5 MG tablet Take 0.5 mg by mouth at bedtime as needed for sleep (or restlessness).    Yes [provider]  metoprolol succinate (TOPROL-XL) 25 MG  24 hr tablet Take 12.5 mg by mouth in the morning and at bedtime.   Yes [provider]  sodium chloride 1 g tablet Take 1 g by mouth 2 (two) times daily with a meal.   Yes [provider]  tamsulosin (FLOMAX) 0.4 MG CAPS capsule Take 0.4 mg by mouth daily after breakfast.    Yes [provider]  L-Methylfolate-B12-B6-B2 (CEREFOLIN) 10-24-48-5 MG TABS Take 1 tablet by mouth daily. Patient not taking: Reported on 04/17/2020 09/22/16   Nilda Riggs, NP    Allergies    Ampicillin and Metronidazole  Review of Systems   Review of Systems  Unable to perform ROS: Mental status change    Physical Exam Updated Vital Signs BP (!) 157/77 (BP  Location: Left Arm)   Pulse 69   Temp 97.8 F (36.6 C) (Temporal)   Resp 14   SpO2 100%   Physical Exam Vitals and nursing note reviewed.  HENT:     Head: Atraumatic.  Eyes:     Comments: Right pupil more constricted than left.  Eyes will move to left and up-and-down but does not appear to cross to the right.  Cardiovascular:     Rate and Rhythm: Regular rhythm.  Pulmonary:     Breath sounds: No wheezing or rhonchi.  Abdominal:     Tenderness: There is no abdominal tenderness.  Musculoskeletal:        General: No tenderness.     Cervical back: Neck supple.  Skin:    General: Skin is warm.  Neurological:     Mental Status: He is alert.     Comments: Nonverbal.  Will track to me to the left but does not appear to cross to the right.  Will not actively move his extremities.  Will wince a little with pain but not moving extremities.  Will also look up and down.  Nonverbal..  No clonus.     ED Results / Procedures / Treatments   Labs (all labs ordered are listed, but only abnormal results are displayed) Labs Reviewed  CBC WITH DIFFERENTIAL/PLATELET - Abnormal; Notable for the following components:      Result Value   RBC 4.10 (*)    All other components within normal limits  COMPREHENSIVE METABOLIC PANEL - Abnormal; Notable for the following components:   Sodium 128 (*)    Chloride 94 (*)    Total Protein 5.7 (*)    All other components within normal limits  PROTIME-INR - Abnormal; Notable for the following components:   Prothrombin Time 15.3 (*)    INR 1.3 (*)    All other components within normal limits  I-STAT CHEM 8, ED  TROPONIN I (HIGH SENSITIVITY)    EKG EKG Interpretation  Date/Time:  Tuesday April 17 2020 12:49:11 EST Ventricular Rate:  59 PR Interval:    QRS Duration: 134 QT Interval:  447 QTC Calculation: 443 R Axis:   54 Text Interpretation: Sinus rhythm Right bundle branch block Confirmed by Benjiman Core [phone]) on 04/17/2020 1:11:36  PM   Radiology CT Head Wo Contrast  Result Date: 04/17/2020 CLINICAL DATA:  Neuro deficit, acute stroke suspected. Altered mental status. EXAM: CT HEAD WITHOUT CONTRAST TECHNIQUE: Contiguous axial images were obtained from the base of the skull through the vertex without intravenous contrast. COMPARISON:  CT head 12/29/2018. FINDINGS: Motion limited exam.  Within this limitation: Brain: No evidence of acute large vascular territory infarction, hemorrhage, hydrocephalus, extra-axial collection or mass lesion/mass effect. Remote left basal  ganglia lacunar infarct. Patchy white matter hypoattenuation, most likely related to chronic microvascular ischemic disease. Generalized cerebral atrophy with ex vacuo dilation. Vascular: Calcific atherosclerosis. Skull: No acute fracture. Sinuses/Orbits: No acute finding. Other: No mastoid effusions. IMPRESSION: 1. Motion limited exam without specific evidence of acute intracranial abnormality. MRI could better evaluate for acute infarct if clinically indicated. 2. Chronic microvascular ischemic change and generalized atrophy. Electronically Signed   By: Feliberto Harts MD   On: 04/17/2020 13:34    Procedures Procedures (including critical care time)  Medications Ordered in ED Medications  sodium chloride 0.9 % bolus 1,000 mL (0 mLs Intravenous Stopped 04/17/20 1552)  LORazepam (ATIVAN) injection 0.5 mg (0.5 mg Intravenous Given 04/17/20 1400)    ED Course  I have reviewed the triage vital signs and the nursing notes.  Pertinent labs & imaging results that were available during my care of the patient were reviewed by me and considered in my medical decision making (see chart for details).    MDM Rules/Calculators/A&P                          Patient brought in for mental status changes/unresponsiveness.  Per EMS reportedly is alert and oriented at baseline.  However discussed with family members the patient's daughter who is a Engineer, civil (consulting) and power of  attorney and the patient's wife.  States that he has complete memory loss since previous stroke.  However does speak somewhat at baseline.  Reportedly has episodes where he will go unresponsive.  Patient will want no intubation would not want overly aggressive intervention needed. After discussion with family went back in the patient's room and he was more awake.  Now verbal again.  Will follow commands bilaterally also.  Patient has become a little more agitated.  Discussed with patient's family.  Will discharge home.  Do not think the increased agitation doing being in the ER is worth any benefit will get out of it since we are unlikely to act on most of the data we would get.  Has a chronic hyponatremia.  Will discharge home. Final Clinical Impression(s) / ED Diagnoses Final diagnoses:  Altered mental status, unspecified altered mental status type  Hyponatremia    Rx / DC Orders ED Discharge Orders    None       Benjiman Core, MD 04/17/20 [phone]

## 2020-04-17 NOTE — Discharge Instructions (Addendum)
Follow-up with his doctor as needed.  Try and keep him hydrated.

## 2020-04-17 NOTE — Progress Notes (Signed)
Responded to request to support patient family. Escorted  Daughter to bedside to visit with patient. Provided emotional and spiritual support. To family. At bedside and family in consultation room.  Venida Jarvis, [place], [facility], Pager [phone]

## 2020-04-17 NOTE — ED Notes (Signed)
Unable to get vital signs continually due to patient confusion and agitation. MD Pickering notified.

## 2020-07-09 ENCOUNTER — Emergency Department (HOSPITAL_COMMUNITY)
Admission: EM | Admit: 2020-07-09 | Discharge: 2020-07-09 | Disposition: A | Discharge status: Home / Self Care | Payer: Medicare Other | Attending: Emergency Medicine | Admitting: Emergency Medicine

## 2020-07-09 DIAGNOSIS — I10 Essential (primary) hypertension: Secondary | ICD-10-CM | POA: Diagnosis not present

## 2020-07-09 DIAGNOSIS — R011 Cardiac murmur, unspecified: Secondary | ICD-10-CM | POA: Insufficient documentation

## 2020-07-09 DIAGNOSIS — R4020 Unspecified coma: Secondary | ICD-10-CM | POA: Insufficient documentation

## 2020-07-09 DIAGNOSIS — R55 Syncope and collapse: Secondary | ICD-10-CM | POA: Diagnosis not present

## 2020-07-09 DIAGNOSIS — I2510 Atherosclerotic heart disease of native coronary artery without angina pectoris: Secondary | ICD-10-CM | POA: Insufficient documentation

## 2020-07-09 DIAGNOSIS — Z79899 Other long term (current) drug therapy: Secondary | ICD-10-CM | POA: Diagnosis not present

## 2020-07-09 DIAGNOSIS — F0390 Unspecified dementia without behavioral disturbance: Secondary | ICD-10-CM | POA: Diagnosis not present

## 2020-07-09 DIAGNOSIS — Z7901 Long term (current) use of anticoagulants: Secondary | ICD-10-CM | POA: Insufficient documentation

## 2020-07-09 DIAGNOSIS — Z7189 Other specified counseling: Secondary | ICD-10-CM

## 2020-07-09 DIAGNOSIS — Z87891 Personal history of nicotine dependence: Secondary | ICD-10-CM | POA: Insufficient documentation

## 2020-07-09 DIAGNOSIS — R4189 Other symptoms and signs involving cognitive functions and awareness: Secondary | ICD-10-CM | POA: Diagnosis not present

## 2020-07-09 DIAGNOSIS — Z66 Do not resuscitate: Secondary | ICD-10-CM

## 2020-07-09 DIAGNOSIS — Z515 Encounter for palliative care: Secondary | ICD-10-CM

## 2020-07-09 DIAGNOSIS — Z96659 Presence of unspecified artificial knee joint: Secondary | ICD-10-CM | POA: Insufficient documentation

## 2020-07-09 DIAGNOSIS — Z789 Other specified health status: Secondary | ICD-10-CM

## 2020-07-09 LAB — BASIC METABOLIC PANEL
Anion gap: 8 (ref 5–15)
BUN: 15 mg/dL (ref 8–23)
CO2: 23 mmol/L (ref 22–32)
Calcium: 8.4 mg/dL — ABNORMAL LOW (ref 8.9–10.3)
Chloride: 101 mmol/L (ref 98–111)
Creatinine, Ser: 1.07 mg/dL (ref 0.61–1.24)
GFR, Estimated: >60 mL/min (ref >60)
Glucose, Bld: 134 mg/dL — ABNORMAL HIGH (ref 70–99)
Potassium: 4.3 mmol/L (ref 3.5–5.1)
Sodium: 132 mmol/L — ABNORMAL LOW (ref 135–145)

## 2020-07-09 LAB — CBC
HCT: 43.2 % (ref 39.0–52.0)
Hemoglobin: 14.5 g/dL (ref 13.0–17.0)
MCH: 33.4 pg (ref 26.0–34.0)
MCHC: 33.6 g/dL (ref 30.0–36.0)
MCV: 99.5 fL (ref 80.0–100.0)
Platelets: 182 10*3/uL (ref 150–400)
RBC: 4.34 MIL/uL (ref 4.22–5.81)
RDW: 13 % (ref 11.5–15.5)
WBC: 6.8 10*3/uL (ref 4.0–10.5)
nRBC: 0 % (ref 0.0–0.2)

## 2020-07-09 NOTE — ED Triage Notes (Signed)
Pt bib ems with syncopal event today. Initial BP 72/76 with no radials. CBG 156. 97.74F, HR 76. Pt cyanotic on scene with saturations in the low 80's. EMS placed pt on NRB with improvement to 94%. Given 1000cc NS with BP improvement to 124 systolic. Pt altered at baseline, hx of dementia. Pt clammy on arrival.

## 2020-07-09 NOTE — Progress Notes (Signed)
Civil engineer, contracting [facility])  Received request from [facility] for hospice services at home after discharge.  Chart and pt information under review by [facility] physician.  Hospice eligibility pending at this time.  Hospital liaison called son Renae Fickle; voice mailbox full, unable to eave message.  Pt discharged at this time.  [facility] will follow up as outpatient.  Thank you for the opportunity to participate in this pt's care.  Gillian Scarce, BSN, RN [employer] [phone] (24h on call)

## 2020-07-09 NOTE — ED Notes (Signed)
Pt unable to tolerate vital signs. Patient Alert and oriented to baseline. Stable and ambulatory to baseline. Patient verbalized understanding of the discharge instructions.  Patient belongings were taken by the patient.

## 2020-07-09 NOTE — ED Notes (Signed)
Pt given turkey sandwich and water

## 2020-07-09 NOTE — Discharge Instructions (Signed)
It was our pleasure to provide your ER care today - we hope that you feel better.  We have made a palliative medicine/care consult, as well as referral for home health services - they should be contacting you in the next couple of days.   Fall precautions. Encourage po fluids/nutrition.   Return to ER if worse, new symptoms, fevers, new or severe pain, trouble breathing, or other concern.

## 2020-07-09 NOTE — ED Notes (Signed)
Pt brief changed. Pt update on plan of care.

## 2020-07-09 NOTE — Consult Note (Signed)
Consultation Note Date: 07/09/2020   Patient Name: Wyatt [name]  DOB: [birth year]  MRN: [id number]  Age / Sex: 87 y.o., male  PCP: Jani Gravel, MD Referring Physician: Lajean Saver, MD  Reason for Consultation: Establishing goals of care  HPI/Patient Profile: 87 y.o. male  with past medical history of syncope, MI, CVA, advanced dementia, atrial fibrillation presented to the ED on 07/09/20 from home after an unresponsive/syncopal episode. Palliative care was consulted for assistance with hospice vs palliative care discussions for Wyatt [name].  Patient and family face treatment option decisions and anticipatory care needs.  Clinical Assessment and Goals of Care: I have reviewed medical records including EPIC notes, labs, and imaging. Received report from primary RN - no acute concerns.   Went to visit patient at bedside - wife/[name] and son/Wyatt [name] were present. Patient was lying in bed awake, disoriented, restless, and unable to participate in conversation. No signs or non-verbal gestures of pain or discomfort noted. No respiratory distress, increased work of breathing, or secretions noted.   Met with wife/[name] in ED conference room to discuss diagnosis, prognosis, GOC, EOL wishes, disposition, and options.  I introduced Palliative Medicine as specialized medical care for people living with serious illness. It focuses on providing relief from the symptoms and stress of a serious illness. The goal is to improve quality of life for both the patient and the family.  We discussed a brief life review of the patient as well as functional and nutritional status. The patient and his wife/[name] have been married 27 years, they had 6 children together, 5 of whom are still living. [name] is described by his wife as someone who loved being active, playing golf, doing yard work, and loved to sing and listen to music.  Prior to ED visit today, patient was living in a private residence with his wife. Wyatt [name] has noticed a physical and mental decline in the patient, especially over the last two years; however, she has noted a significant decline over the last several months. The patient was able to walk very short distances with a walker but now also requires physical assistance. He does require assistance with bathing and dressing. He has been eating/drinking well but Wyatt [name] states she has had trouble getting him to take medication - he spits it out or pretends to swallow and pockets pills. The patient has fallen at least 3 times over the last 2 months. Wyatt [name] tells me he has moments of mental clarity but also has times where he hallucinates. The patient has been sleeping a lot more lately during the day and at night. Wyatt [name] tells me there are times where the patient does not recognize her or his children.  She also describes his current state as very weak - she feels she is no longer able to care for him safely at home (he has caused her to fall twice). The patient does have a caregiver that assists with his needs Mon-Friday from 9-5pm.   We discussed patient's current illness and what it means in  the larger context of patient's on-going co-morbidities. Wyatt [name] understands that dementia is a progressive, non-curable disease underlying the patient's current acute medical conditions. Natural disease trajectory and expectations at EOL were discussed. I attempted to elicit values and goals of care important to the patient. The difference between aggressive medical intervention and comfort care was considered in light of the patient's goals of care. We discussed the patient's syncopal episodes and possible underlying causes - Wyatt [name] tells me that she is not interested in work up because she knows she is not interested in pursuing aggressive procedural interventions such as pacemaker. Wyatt [name] tells me the patient has had  several syncopal events over the last year and her goal is to not have the patient admitted for these episodes - she wants to keep him comfortable for the time he has left. She expressed she did not want to bring him to ED today after his event, but the caregiver told her to call 911 so she did. Wife feels she is prolonging his suffering by coming to ED.   Hospice and Palliative Care services outpatient were explained and offered. Wyatt [name] would like patient evaluated for hospice services; if he is not approved, she is agreeable to outpatient Palliative Care to follow. She is interested in [place].  Also discussed the option of placing patient in a long term care facility to [place]. She is very interested in this - she specifically mentions [name]. I briefly reviewed insurance information for hospice services at a LTC with [facility] states paying out of pocket for care is not a concern.  Concepts specific to code status, artificial feeding and hydration, and rehospitalization were considered and discussed. Patient's advanced directives in [place] are still reflective of patient's/families wishes for his care.   After speaking with Wyatt [name], spoke with son/Wyatt [name] in person - provided him updates on conversation with Wyatt [name]. He is relieved she is willing to accept help. He states he has been advocating for Palliative Care assistance for over a year. He also mentions [facility]TC[facility] for possible placement option. I notified him I would speak with LCSW about their requested facilities. [name] tells me he is the patient's HCPOA - I encouraged him to provided a copy of paperwork or email me a copy for our records - [facility] email address provided. [name] is agreeable for discharge from ED - he nor Wyatt [name] want admission today. He is agreeable for hospice evaluation and support if accepted; at the least they are open to outpatient Palliative Care to follow. He is also agreeable for LCSW  to continue to assist with finding placement for LTC facilities.   Discussed with patient/family the importance of continued conversation with each other and the medical providers regarding overall plan of care and treatment options, ensuring decisions are within the context of the patient's values and GOCs.    Questions and concerns were addressed. The patient/family was encouraged to call with questions and/or concerns. PMT card was provided.   Primary Decision Maker: HCPOA son/Wyatt [name] [place] - do not have copies of paperwork. Without paperwork, legal decisions fall to wife/[name]. [name] and Wyatt [name] seem to work well together to make decisions on the patient's behalf.     SUMMARY OF RECOMMENDATIONS:  Family are not interested in [place] work up for syncopal events - they do not want admission today and are not interested in aggressive medical interventions  Family's goal for patient is comfort measures -  their goal is to not have him return to  the hospital once discharged from ED  Family would like patient evaluated for hospice services - [facility] liaison notified - patient may qualify under recurrent life-threatning syncopal events with undiagnosed etiology. If he does not qualify for hospice, family are ok with outpatient Palliative Care to follow  TOC notified and consult placed for: family interest in getting patient placed in LTC facility as well as hospice evaluation  Family are ok with patient returning home while trying to get placement at LTC facility  Please see MOST form in [place]/ACP tab for advanced directive outlining medical boundaries  Updated code status to DNR/DNI per documents in [place] and family's wishes   Code Status/Advance Care Planning:  DNR  Palliative Prophylaxis:   Aspiration, Bowel Regimen, Delirium Protocol, Frequent Pain Assessment, Oral Care and Turn Reposition  Additional Recommendations (Limitations, Scope, Preferences):  Full Comfort  Care  Psycho-social/Spiritual:   Desire for further Chaplaincy support:no Created space and opportunity for patient and family to express thoughts and feelings regarding patient's current medical situation.   Emotional support and therapeutic listening provided.  Prognosis:   < 6 months if he continues with life-threatening syncopal events  Discharge Planning: home - hospice evaluation pending, family hopeful for LTF placement      Primary Diagnoses: Present on Admission: **None**   I have reviewed the medical record, interviewed the patient and family, and examined the patient. The following aspects are pertinent.  Past Medical History:  Diagnosis Date  . Anxiety   . Atrial fibrillation ([place])   . CAD (coronary artery disease)   . CVA (cerebral infarction)    while in [place]  . Diverticulosis   . Hyperlipemia   . Hypertension   . Inferior MI ([place]) 2001   stent to RCA-Dr. Olevia Perches  . Lymphocytic colitis   . Personal history of colonic polyps 04/2009   SERRATED ADENOMA  . Stroke ([place])   . Syncope and collapse 2014   in [place] prior to the stroke by 24 hours.   Social History   Socioeconomic History  . Marital status: Married    Spouse name: Not on file  . Number of children: 5  . Years of education: college  . Highest education level: Not on file  Occupational History  . Occupation: Retired  Tobacco Use  . Smoking status: Former Research scientist (life sciences)  . Smokeless tobacco: Never Used  Vaping Use  . Vaping Use: Never used  Substance and Sexual Activity  . Alcohol use: No  . Drug use: No  . Sexual activity: Not on file  Other Topics Concern  . Not on file  Social History Narrative   Patient is married with 5 children.   Patient is right handed.   Patient has college education.   Patient drinks 3 cups daily.   Social Determinants of Health   Financial Resource Strain: Not on file  Food Insecurity: Not on file  Transportation Needs: Not on file  Physical Activity: Not  on file  Stress: Not on file  Social Connections: Not on file   Family History  Problem Relation Age of Onset  . Liver cancer Father   . Cancer Sister   . Cancer Sister   . Colon cancer Neg Hx    Scheduled Meds: Continuous Infusions: PRN Meds:. Medications Prior to Admission:  Prior to Admission medications   Medication Sig Start Date End Date Taking? Authorizing Provider  ELIQUIS 5 MG TABS tablet TAKE 1 TABLET BY MOUTH TWICE DAILY Patient taking differently: Take 5 mg by  mouth in the morning and at bedtime. 03/02/17  Yes Hilty, Nadean Corwin, MD  lisinopril (ZESTRIL) 10 MG tablet Take 10 mg by mouth daily at 12 noon.   Yes [provider]  LORazepam (ATIVAN) 0.5 MG tablet Take 0.5 mg by mouth at bedtime as needed for sleep (or restlessness).    Yes [provider]  metoprolol succinate (TOPROL-XL) 25 MG 24 hr tablet Take 12.5 mg by mouth See admin instructions. Take 12.5 mg by mouth in the morning and at suppertime   Yes [provider]  sodium chloride 1 g tablet Take 0.5 g by mouth 2 (two) times daily with a meal.   Yes [provider]  tamsulosin (FLOMAX) 0.4 MG CAPS capsule Take 0.4 mg by mouth daily after breakfast.    Yes [provider]  L-Methylfolate-B12-B6-B2 (CEREFOLIN) 10-24-48-5 MG TABS Take 1 tablet by mouth daily. Patient not taking: Reported on 07/09/2020 09/22/16   Dennie Bible, NP   Allergies  Allergen Reactions  . Tape Other (See Comments)    THE SKIN IS VERY, VERY THIN- WILL TEAR VERY EASILY!! NO ADHESIVES!!  . Ampicillin Other (See Comments)    Exact reaction not recalled  . Metronidazole Other (See Comments)    "Burning skin and hands"   Review of Systems  Unable to perform ROS: Dementia    Physical Exam Vitals and nursing note reviewed.  Constitutional:      General: He is not in acute distress. Pulmonary:     Effort: No respiratory distress.  Skin:    General: Skin is warm and dry.  Neurological:      Mental Status: He is alert. He is disoriented and confused.     Motor: Weakness present.  Psychiatric:        Mood and Affect: Mood is anxious.        Behavior: Behavior is agitated.        Cognition and Memory: Cognition is impaired. Memory is impaired.     Vital Signs: BP (!) 136/108   Pulse 83   Temp (!) 97 F (36.1 C) (Temporal)   Resp 19   SpO2 100%  Pain Scale: 0-10   Pain Score: 0-No pain   SpO2: SpO2: 100 % O2 Device:SpO2: 100 % O2 Flow Rate: .   IO: Intake/output summary: No intake or output data in the 24 hours ending 07/09/20 1340  LBM:   Baseline Weight:   Most recent weight:       Palliative Assessment/Data: 40%     Time In: 1345 Time Out: 1500 Time Total: 75 minutes  Greater than 50%  of this time was spent counseling and coordinating care related to the above assessment and plan.  Signed by: Lin Landsman, NP   Please contact Palliative Medicine Team phone at [phone] for questions and concerns.  For individual provider: See Shea Evans

## 2020-07-09 NOTE — ED Provider Notes (Addendum)
[facility] EMERGENCY DEPARTMENT Provider Note   CSN: [id number] Arrival date & time: 07/09/20  1008     History Chief Complaint  Patient presents with  . Loss of Consciousness    Wyatt [name] is a 87 y.o. male.  Patient with hx afib, advanced dementia, presents via EMS after brief unresponsive episode. Symptoms acute onset this AM, episodic. EMS notes initial bp low 72/ , and CBG 156. Pt received ns bolus, with normalization of blood pressure. No seizure activity noted. No report of trauma, fall, or injury. On arrival to ED patient alert, content appearing.  Pt limited historian - level 5 caveat - dementia.   The history is provided by the patient and the EMS personnel. The history is limited by the condition of the patient.  Loss of Consciousness      Past Medical History:  Diagnosis Date  . Anxiety   . Atrial fibrillation (HCC)   . CAD (coronary artery disease)   . CVA (cerebral infarction)    while in [place]  . Diverticulosis   . Hyperlipemia   . Hypertension   . Inferior MI (HCC) 2001   stent to RCA-Dr. Juanda Chance  . Lymphocytic colitis   . Personal history of colonic polyps 04/2009   SERRATED ADENOMA  . Stroke (HCC)   . Syncope and collapse 2014   in [place] prior to the stroke by 24 hours.    Patient Active Problem List   Diagnosis Date Noted  . On anticoagulant therapy 06/10/2017  . Aortic valve stenosis 05/08/2016  . MCI (mild cognitive impairment) 07/26/2015  . Dysphasia 10/07/2014  . Hyponatremia 10/07/2014  . TIA (transient ischemic attack)   . Mild cognitive impairment with memory loss 09/22/2014  . History of stroke 04/11/2014  . Mild cognitive impairment 08/11/2013  . Long term current use of anticoagulant therapy 04/11/2013  . PAF (paroxysmal atrial fibrillation) (HCC) 03/28/2013  . Atrial flutter (HCC) 03/28/2013  . Syncope 03/28/2013  . Hyposmolality and/or hyponatremia 03/28/2013  . Palpitations 03/15/2013  . Frequent PVCs  03/15/2013  . CAD in native artery 03/15/2013  . Abnormality of gait 03/01/2013  . Essential hypertension 03/01/2013  . Essential familial hyperlipidemia 03/01/2013  . Vitamin B12 deficiency 09/12/2010  . Diarrhea 08/26/2010    Past Surgical History:  Procedure Laterality Date  . CORONARY ANGIOPLASTY WITH STENT PLACEMENT  2001   Dr. Eden Emms cathed, Dr Juanda Chance stented  . TOTAL KNEE ARTHROPLASTY         Family History  Problem Relation Age of Onset  . Liver cancer Father   . Cancer Sister   . Cancer Sister   . Colon cancer Neg Hx     Social History   Tobacco Use  . Smoking status: Former Games developer  . Smokeless tobacco: Never Used  Vaping Use  . Vaping Use: Never used  Substance Use Topics  . Alcohol use: No  . Drug use: No    Home Medications Prior to Admission medications   Medication Sig Start Date End Date Taking? Authorizing Provider  acetaminophen (TYLENOL) 325 MG tablet Take 325 mg by mouth every 6 (six) hours as needed for mild pain.     [provider]  ELIQUIS 5 MG TABS tablet TAKE 1 TABLET BY MOUTH TWICE DAILY Patient taking differently: Take 5 mg by mouth 2 (two) times daily.  03/02/17   Hilty, Lisette Abu, MD  L-Methylfolate-B12-B6-B2 (CEREFOLIN) 10-24-48-5 MG TABS Take 1 tablet by mouth daily. Patient not taking: Reported on  04/17/2020 09/22/16   Nilda Riggs, NP  LORazepam (ATIVAN) 0.5 MG tablet Take 0.5 mg by mouth at bedtime as needed for sleep (or restlessness).     [provider]  metoprolol succinate (TOPROL-XL) 25 MG 24 hr tablet Take 12.5 mg by mouth in the morning and at bedtime.    [provider]  sodium chloride 1 g tablet Take 1 g by mouth 2 (two) times daily with a meal.    [provider]  tamsulosin (FLOMAX) 0.4 MG CAPS capsule Take 0.4 mg by mouth daily after breakfast.     [provider]    Allergies    Ampicillin and Metronidazole  Review of Systems   Review of Systems  Unable to  perform ROS: Dementia  Cardiovascular: Positive for syncope.  level 5 caveat  - dementia  Physical Exam Updated Vital Signs There were no vitals taken for this visit.  Physical Exam Vitals and nursing note reviewed.  Constitutional:      Appearance: Normal appearance. He is well-developed.  HENT:     Head: Atraumatic.     Nose: Nose normal.     Mouth/Throat:     Mouth: Mucous membranes are moist.     Pharynx: Oropharynx is clear.  Eyes:     General: No scleral icterus.    Conjunctiva/sclera: Conjunctivae normal.     Comments: Right pupils 2 mm, left pupil 3 mm (patient previously noted with unequal pupils).   Neck:     Vascular: No carotid bruit.     Trachea: No tracheal deviation.     Comments: No stiffness or rigidity.  Cardiovascular:     Rate and Rhythm: Normal rate and regular rhythm.     Pulses: Normal pulses.     Heart sounds: Murmur heard.  No friction rub. No gallop.   Pulmonary:     Effort: Pulmonary effort is normal. No accessory muscle usage or respiratory distress.     Breath sounds: Normal breath sounds.  Abdominal:     General: Bowel sounds are normal. There is no distension.     Palpations: Abdomen is soft.     Tenderness: There is no abdominal tenderness. There is no guarding.  Genitourinary:    Comments: No cva tenderness. Musculoskeletal:        General: No swelling or tenderness.     Cervical back: Normal range of motion and neck supple. No rigidity.  Skin:    General: Skin is warm and dry.     Findings: No rash.  Neurological:     Mental Status: He is alert.     Comments: Awake and alert. Moves bil extremities purposefully. No facial weakness or droop.  Psychiatric:        Mood and Affect: Mood normal.     ED Results / Procedures / Treatments   Labs (all labs ordered are listed, but only abnormal results are displayed) Results for orders placed or performed during the hospital encounter of 07/09/20  CBC  Result Value Ref Range   WBC 6.8  4.0 - 10.5 K/uL   RBC 4.34 4.22 - 5.81 MIL/uL   Hemoglobin 14.5 13.0 - 17.0 g/dL   HCT 96.2 83.6 - 62.9 %   MCV 99.5 80.0 - 100.0 fL   MCH 33.4 26.0 - 34.0 pg   MCHC 33.6 30.0 - 36.0 g/dL   RDW 47.6 54.6 - 50.3 %   Platelets 182 150 - 400 K/uL   nRBC 0.0 0.0 - 0.2 %  Basic metabolic panel  Result Value Ref Range   Sodium 132 (L) 135 - 145 mmol/L   Potassium 4.3 3.5 - 5.1 mmol/L   Chloride 101 98 - 111 mmol/L   CO2 23 22 - 32 mmol/L   Glucose, Bld 134 (H) 70 - 99 mg/dL   BUN 15 8 - 23 mg/dL   Creatinine, Ser 7.20 0.61 - 1.24 mg/dL   Calcium 8.4 (L) 8.9 - 10.3 mg/dL   GFR, Estimated >94 >70 mL/min   Anion gap 8 5 - 15    EKG EKG Interpretation  Date/Time:  Monday July 09 2020 10:18:09 EST Ventricular Rate:  80 PR Interval:    QRS Duration: 133 QT Interval:  426 QTC Calculation: 492 R Axis:   54 Text Interpretation: Sinus rhythm Right bundle branch block Nonspecific T wave abnormality Confirmed by Cathren Laine ([id number]) on 07/09/2020 10:26:22 AM   Radiology No results found.  Procedures Procedures   Medications Ordered in ED Medications - No data to display  ED Course  I have reviewed the triage vital signs and the nursing notes.  Pertinent labs & imaging results that were available during my care of the patient were reviewed by me and considered in my medical decision making (see chart for details).    MDM Rules/Calculators/A&P                         Iv ns. Continuous pulse ox and cardiac monitoring. Stat labs. Ecg.   Reviewed nursing notes and prior charts for additional history.  On review of prior charts, ED visit 03/2020, and PCP 06/2019 commenting on history of similar recurrent transient unresponsive episodes.  Pt with dnr/most form indicating DNR status.   Recheck pt, awake, alert, content. nsr on monitor.   Labs reviewed/interpreted by me - wbc normal, hgb normal, k normal, Na c/w prior, normal renal fxn.   Pts unresponsive episodes (for past year  or so) do sound concerning. Pt has hx AS although no critical AS by history/echo, also concern for possible dysrhythmia, and seizure also possibility although would be very atypical for that. On discussion with pt/spouse/son, all appear in agreement that even if dysthymia or other serious underlying pathology found, they/pt would not want any type of procedural intervention, pacemaker, defibrillator, etc.  As such, do not feel admission for further workup beneficial. Pt/family are agreeable with palliative/hospice referral, home health, etc.   Palliative med referral made.  Order for [facility] services.   Pt currently appears at his baseline (with extremely guarded/limited longer term prognosis), and in no apparent pain or discomfort. Given po fluids/food.        Final Clinical Impression(s) / ED Diagnoses Final diagnoses:  None    Rx / DC Orders ED Discharge Orders    None         Cathren Laine, MD 07/09/20 1307

## 2021-04-25 DEATH — deceased
